# Patient Record
Sex: Female | Born: 1990
Health system: Southern US, Community
[De-identification: ages and names within clinical notes are randomized; demographics above are authoritative.]

## PROBLEM LIST (undated history)

## (undated) DIAGNOSIS — S86012A Strain of left Achilles tendon, initial encounter: Secondary | ICD-10-CM

---

## 2010-11-17 ENCOUNTER — Emergency Department (HOSPITAL_BASED_OUTPATIENT_CLINIC_OR_DEPARTMENT_OTHER)
Admission: EM | Admit: 2010-11-17 | Discharge: 2010-11-17 | Payer: Self-pay | Source: Home / Self Care | Admitting: Emergency Medicine

## 2010-11-18 ENCOUNTER — Emergency Department (HOSPITAL_BASED_OUTPATIENT_CLINIC_OR_DEPARTMENT_OTHER)
Admission: EM | Admit: 2010-11-18 | Discharge: 2010-11-19 | Disposition: A | Discharge status: Still In Hospital | Payer: Self-pay | Source: Home / Self Care | Admitting: Emergency Medicine

## 2011-02-02 LAB — URINE CULTURE: Culture  Setup Time: 201112270408

## 2011-02-02 LAB — GC/CHLAMYDIA PROBE AMP, GENITAL
Chlamydia, DNA Probe: NEGATIVE
GC Probe Amp, Genital: NEGATIVE

## 2011-02-02 LAB — URINALYSIS, ROUTINE W REFLEX MICROSCOPIC
Bilirubin Urine: NEGATIVE
Hgb urine dipstick: NEGATIVE

## 2011-02-02 LAB — URINE MICROSCOPIC-ADD ON

## 2011-02-02 LAB — HCG, QUANTITATIVE, PREGNANCY: hCG, Beta Chain, Quant, S: 187853 m[IU]/mL — ABNORMAL HIGH (ref <5)

## 2011-02-02 LAB — WET PREP, GENITAL: Yeast Wet Prep HPF POC: NONE SEEN

## 2011-02-02 LAB — ABO/RH: ABO/RH(D): B POS

## 2011-02-02 LAB — PREGNANCY, URINE: Preg Test, Ur: POSITIVE

## 2011-05-18 ENCOUNTER — Emergency Department (HOSPITAL_BASED_OUTPATIENT_CLINIC_OR_DEPARTMENT_OTHER)
Admission: EM | Admit: 2011-05-18 | Discharge: 2011-05-18 | Disposition: A | Discharge status: Other Acute Inpatient Hospital | Payer: Medicaid Other | Attending: Emergency Medicine | Admitting: Emergency Medicine

## 2011-05-18 DIAGNOSIS — F17200 Nicotine dependence, unspecified, uncomplicated: Secondary | ICD-10-CM | POA: Insufficient documentation

## 2011-05-18 DIAGNOSIS — R109 Unspecified abdominal pain: Secondary | ICD-10-CM | POA: Insufficient documentation

## 2011-05-18 DIAGNOSIS — O2690 Pregnancy related conditions, unspecified, unspecified trimester: Secondary | ICD-10-CM | POA: Insufficient documentation

## 2014-08-04 ENCOUNTER — Encounter (HOSPITAL_BASED_OUTPATIENT_CLINIC_OR_DEPARTMENT_OTHER): Payer: Self-pay | Admitting: Emergency Medicine

## 2014-08-04 ENCOUNTER — Emergency Department (HOSPITAL_BASED_OUTPATIENT_CLINIC_OR_DEPARTMENT_OTHER)
Admission: EM | Admit: 2014-08-04 | Discharge: 2014-08-05 | Disposition: A | Discharge status: Home / Self Care | Payer: Medicaid Other | Attending: Emergency Medicine | Admitting: Emergency Medicine

## 2014-08-04 DIAGNOSIS — Z87891 Personal history of nicotine dependence: Secondary | ICD-10-CM | POA: Insufficient documentation

## 2014-08-04 DIAGNOSIS — L03319 Cellulitis of trunk, unspecified: Principal | ICD-10-CM

## 2014-08-04 DIAGNOSIS — L02219 Cutaneous abscess of trunk, unspecified: Secondary | ICD-10-CM | POA: Insufficient documentation

## 2014-08-04 DIAGNOSIS — K5900 Constipation, unspecified: Secondary | ICD-10-CM | POA: Insufficient documentation

## 2014-08-04 DIAGNOSIS — K5904 Chronic idiopathic constipation: Secondary | ICD-10-CM

## 2014-08-04 DIAGNOSIS — R350 Frequency of micturition: Secondary | ICD-10-CM | POA: Diagnosis not present

## 2014-08-04 DIAGNOSIS — N390 Urinary tract infection, site not specified: Secondary | ICD-10-CM

## 2014-08-04 DIAGNOSIS — L0291 Cutaneous abscess, unspecified: Secondary | ICD-10-CM

## 2014-08-04 LAB — URINALYSIS, ROUTINE W REFLEX MICROSCOPIC
BILIRUBIN URINE: NEGATIVE
Glucose, UA: NEGATIVE mg/dL
HGB URINE DIPSTICK: NEGATIVE
KETONES UR: NEGATIVE mg/dL
NITRITE: NEGATIVE
PROTEIN: NEGATIVE mg/dL
SPECIFIC GRAVITY, URINE: 1.018 (ref 1.005–1.030)
UROBILINOGEN UA: 1 mg/dL (ref 0.0–1.0)
pH: 7 (ref 5.0–8.0)

## 2014-08-04 LAB — URINE MICROSCOPIC-ADD ON

## 2014-08-04 LAB — PREGNANCY, URINE: PREG TEST UR: NEGATIVE

## 2014-08-04 MED ORDER — SULFAMETHOXAZOLE-TRIMETHOPRIM 800-160 MG PO TABS: 1.0000 | ORAL_TABLET | Freq: Two times a day (BID) | ORAL | Status: DC

## 2014-08-04 MED ORDER — IBUPROFEN 800 MG PO TABS
800.0000 mg | ORAL_TABLET | Freq: Three times a day (TID) | ORAL | Status: DC
Start: 2014-08-04 — End: 2015-02-24

## 2014-08-04 NOTE — ED Notes (Signed)
Pt reports boil in perineal area. Also reports abdominal pain. No drainage from site.

## 2014-08-04 NOTE — Discharge Instructions (Signed)
Abscess °An abscess (boil or furuncle) is an infected area on or under the skin. This area is filled with yellowish-white fluid (pus) and other material (debris). °HOME CARE  °· Only take medicines as told by your doctor. °· If you were given antibiotic medicine, take it as directed. Finish the medicine even if you start to feel better. °· If gauze is used, follow your doctor's directions for changing the gauze. °· To avoid spreading the infection: °¨ Keep your abscess covered with a bandage. °¨ Wash your hands well. °¨ Do not share personal care items, towels, or whirlpools with others. °¨ Avoid skin contact with others. °· Keep your skin and clothes clean around the abscess. °· Keep all doctor visits as told. °GET HELP RIGHT AWAY IF:  °· You have more pain, puffiness (swelling), or redness in the wound site. °· You have more fluid or blood coming from the wound site. °· You have muscle aches, chills, or you feel sick. °· You have a fever. °MAKE SURE YOU:  °· Understand these instructions. °· Will watch your condition. °· Will get help right away if you are not doing well or get worse. °Document Released: 04/27/2008 Document Revised: 05/10/2012 Document Reviewed: 01/22/2012 °ExitCare® Patient Information ©2015 [employer]. This information is not intended to replace advice given to you by your health care provider. Make sure you discuss any questions you have with your health care provider. ° °

## 2014-08-04 NOTE — ED Provider Notes (Signed)
CSN: [id number]     Arrival date & time 08/04/14  2101 History  This chart was scribed for Verania Salberg Smitty Cords, MD by Modena Jansky, ED Scribe. This patient was seen in room MH06/MH06 and the patient's care was started at 11:08 PM.  Chief Complaint  Patient presents with  . Abscess   Patient is a 23 y.o. female presenting with abscess. The history is provided by the patient. No language interpreter was used.  Abscess Location:  Ano-genital Ano-genital abscess location:  Groin Abscess quality: induration   Red streaking: no   Duration:  3 days Progression:  Unchanged Chronicity:  New Context: not diabetes   Relieved by:  None tried Worsened by:  Nothing tried Ineffective treatments:  None tried Associated symptoms: no fever   Risk factors: no family hx of MRSA    HPI Comments: Christina Zavala is a 23 y.o. female who presents to the Emergency Department complaining of a boil in her genital area that started 2 days ago. She states that she shaved her genital area about a week ago. She reports that she also has abdominal pain. She reports that she has been constipated for about 4 days. She states that she has been having some urinary frequency for about 2 weeks. She states that her last LNMP was 2 weeks ago. She denies any fever or dysuria.   History reviewed. No pertinent past medical history. History reviewed. No pertinent past surgical history. No family history on file. History  Substance Use Topics  . Smoking status: Former Games developer  . Smokeless tobacco: Not on file  . Alcohol Use: No   OB History   Grav Para Term Preterm Abortions TAB SAB Ect Mult Living                 Review of Systems  Constitutional: Negative for fever.  Gastrointestinal: Positive for constipation.  Genitourinary: Positive for frequency. Negative for dysuria.  All other systems reviewed and are negative.     Allergies  Review of patient's allergies indicates no known allergies.  Home  Medications   Prior to Admission medications   Not on File   BP 131/73  Pulse 60  Temp(Src) 97.8 F (36.6 C) (Oral)  Resp 16  Ht  (1.626 m)  Wt 120 lb (54.432 kg)  BMI 20.59 kg/m2  SpO2 99%  LMP 07/20/2014 Physical Exam  Nursing note and vitals reviewed. Constitutional: She is oriented to person, place, and time. She appears well-developed and well-nourished. No distress.  HENT:  Head: Normocephalic and atraumatic.  Mouth/Throat: Oropharynx is clear and moist.  Eyes: Pupils are equal, round, and reactive to light.  Neck: Neck supple. No tracheal deviation present.  Cardiovascular: Normal rate, regular rhythm and normal heart sounds.   Pulmonary/Chest: Effort normal. No respiratory distress. She has no wheezes. She has no rales.  Abdominal: Soft. There is no tenderness. There is no rebound and no guarding.  Hyperactive bowel sounds.   Genitourinary:  Loss of stool throughout the colon.  1 in long, 19 cm wide abscess in the right groin area.   Musculoskeletal: Normal range of motion.  Neurological: She is alert and oriented to person, place, and time.  Skin: Skin is warm and dry.  Psychiatric: She has a normal mood and affect. Her behavior is normal.    ED Course  Procedures (including critical care time) DIAGNOSTIC STUDIES: Oxygen Saturation is 99% on RA, normal by my interpretation.    COORDINATION OF CARE: 11:12 PM- Pt  advised of plan for treatment and pt agrees.  Labs Review Labs Reviewed - No data to display  Imaging Review No results found.   EKG Interpretation None      MDM   Final diagnoses:  None    INCISION AND DRAINAGE Performed by: Jasmine Awe Consent: Verbal consent obtained. Risks and benefits: risks, benefits and alternatives were discussed Type: abscess  Body area: mons pubis  Anesthesia: local infiltration  Incision was made with a scalpel.  Local anesthetic: lidocaine 2%  Anesthetic total: 3 ml  Complexity:  complex Blunt dissection to break up loculations  Drainage: purulent  Drainage amount: copious  Patient tolerance: Patient tolerated the procedure well with no immediate complications.   Has uti and constipation.  Miralax daily and Bactrim DS to cover UTI and MRSA.  Follow up with your family doctor.     I personally performed the services described in this documentation, which was scribed in my presence. The recorded information has been reviewed and is accurate.      Jasmine Awe, MD 08/04/14 [id number]

## 2015-02-24 ENCOUNTER — Encounter (HOSPITAL_BASED_OUTPATIENT_CLINIC_OR_DEPARTMENT_OTHER): Payer: Self-pay

## 2015-02-24 ENCOUNTER — Emergency Department (HOSPITAL_BASED_OUTPATIENT_CLINIC_OR_DEPARTMENT_OTHER)
Admission: EM | Admit: 2015-02-24 | Discharge: 2015-02-25 | Disposition: A | Discharge status: Home / Self Care | Payer: Medicaid Other | Attending: Emergency Medicine | Admitting: Emergency Medicine

## 2015-02-24 DIAGNOSIS — Z720 Tobacco use: Secondary | ICD-10-CM | POA: Insufficient documentation

## 2015-02-24 DIAGNOSIS — Z3202 Encounter for pregnancy test, result negative: Secondary | ICD-10-CM | POA: Diagnosis not present

## 2015-02-24 DIAGNOSIS — Z792 Long term (current) use of antibiotics: Secondary | ICD-10-CM | POA: Diagnosis not present

## 2015-02-24 DIAGNOSIS — N12 Tubulo-interstitial nephritis, not specified as acute or chronic: Secondary | ICD-10-CM | POA: Diagnosis not present

## 2015-02-24 DIAGNOSIS — R1030 Lower abdominal pain, unspecified: Secondary | ICD-10-CM | POA: Diagnosis present

## 2015-02-24 DIAGNOSIS — Z9889 Other specified postprocedural states: Secondary | ICD-10-CM | POA: Diagnosis not present

## 2015-02-24 LAB — URINALYSIS, ROUTINE W REFLEX MICROSCOPIC
Bilirubin Urine: NEGATIVE
GLUCOSE, UA: NEGATIVE mg/dL
Hgb urine dipstick: NEGATIVE
KETONES UR: NEGATIVE mg/dL
NITRITE: POSITIVE — AB
Protein, ur: NEGATIVE mg/dL
SPECIFIC GRAVITY, URINE: 1.022 (ref 1.005–1.030)
UROBILINOGEN UA: 1 mg/dL (ref 0.0–1.0)
pH: 6 (ref 5.0–8.0)

## 2015-02-24 LAB — URINE MICROSCOPIC-ADD ON

## 2015-02-24 LAB — PREGNANCY, URINE: PREG TEST UR: NEGATIVE

## 2015-02-24 MED ORDER — PHENAZOPYRIDINE HCL 200 MG PO TABS: 200.0000 mg | ORAL_TABLET | Freq: Three times a day (TID) | ORAL | Status: DC

## 2015-02-24 MED ORDER — PHENAZOPYRIDINE HCL 100 MG PO TABS
200.0000 mg | ORAL_TABLET | Freq: Once | ORAL | Status: AC
  Administered 2015-02-25: 200 mg via ORAL
  Filled 2015-02-24: qty 2

## 2015-02-24 MED ORDER — CEFTRIAXONE SODIUM 1 G IJ SOLR
INTRAMUSCULAR | Status: AC
  Filled 2015-02-24: qty 10

## 2015-02-24 MED ORDER — ONDANSETRON HCL 4 MG/2ML IJ SOLN
4.0000 mg | Freq: Once | INTRAMUSCULAR | Status: AC
  Administered 2015-02-24: 4 mg via INTRAVENOUS
  Filled 2015-02-24: qty 2

## 2015-02-24 MED ORDER — CIPROFLOXACIN HCL 500 MG PO TABS: 500.0000 mg | ORAL_TABLET | Freq: Two times a day (BID) | ORAL | Status: DC

## 2015-02-24 MED ORDER — FENTANYL CITRATE 0.05 MG/ML IJ SOLN
50.0000 ug | Freq: Once | INTRAMUSCULAR | Status: AC
  Administered 2015-02-24: 50 ug via INTRAVENOUS
  Filled 2015-02-24: qty 2

## 2015-02-24 MED ORDER — PROMETHAZINE HCL 25 MG PO TABS: 25.0000 mg | ORAL_TABLET | Freq: Four times a day (QID) | ORAL | Status: DC | PRN

## 2015-02-24 MED ORDER — FLUCONAZOLE 150 MG PO TABS: ORAL_TABLET | ORAL | Status: DC

## 2015-02-24 MED ORDER — DEXTROSE 5 % IV SOLN
1.0000 g | Freq: Once | INTRAVENOUS | Status: AC
  Administered 2015-02-24: 1 g via INTRAVENOUS

## 2015-02-24 MED ORDER — SODIUM CHLORIDE 0.9 % IV BOLUS (SEPSIS): 1000.0000 mL | Freq: Once | INTRAVENOUS | Status: DC

## 2015-02-24 NOTE — ED Provider Notes (Signed)
CSN: [id number]     Arrival date & time 02/24/15  2020 History  This chart was scribed for Paula Libra, MD by Tonye Royalty, ED Scribe. This patient was seen in room MH09/MH09 and the patient's care was started at 10:54 PM.    Chief Complaint  Patient presents with  . Abdominal Pain   The history is provided by the patient. No language interpreter was used.    HPI Comments: Christina Zavala is a 24 y.o. female who presents to the Emergency Department complaining of nausea and vomiting with onset 2 days ago. She reports associated "really bad" lower abdominal pain, sharp back pain, intermittent subjective fever and dysuria. Pain is worse with palpation or movement. Her menses started today. She denies diarrhea or chills.  She alleges to have lost 30 pounds over the past two days. She is not tachycardic.  History reviewed. No pertinent past medical history. Past Surgical History  Procedure Laterality Date  . Cesarean section     No family history on file. History  Substance Use Topics  . Smoking status: Current Every Day Smoker -- 0.50 packs/day    Types: Cigarettes  . Smokeless tobacco: Not on file  . Alcohol Use: Yes     Comment: occ   OB History    No data available     Review of Systems A complete 10 system review of systems was obtained and all systems are negative except as noted in the HPI and PMH.    Allergies  Review of patient's allergies indicates no known allergies.  Home Medications   Prior to Admission medications   Medication Sig Start Date End Date Taking? Authorizing Provider  ibuprofen (ADVIL,MOTRIN) 800 MG tablet Take 1 tablet (800 mg total) by mouth 3 (three) times daily. 08/04/14   April Palumbo, MD  sulfamethoxazole-trimethoprim (SEPTRA DS) 800-160 MG per tablet Take 1 tablet by mouth every 12 (twelve) hours. 08/04/14   April Palumbo, MD   BP 117/80 mmHg  Pulse 87  Temp(Src) 98.8 F (37.1 C) (Oral)  Resp 16  Ht  (1.626 m)  Wt 135 lb (61.236 kg)   BMI 23.16 kg/m2  SpO2 100%  LMP 02/21/2015   Physical Exam  Nursing note and vitals reviewed. General: Well-developed, well-nourished female in no acute distress; appearance consistent with age of record HENT: normocephalic; atraumatic; mucous membranes moist Eyes: pupils equal, round and reactive to light; extraocular muscles intact Neck: supple Heart: regular rate and rhythm; 2/6 systolic murmur at RUSB Lungs: clear to auscultation bilaterally Abdomen: soft; nondistended; no masses or hepatosplenomegaly; bowel sounds present; diffuse abdominal tenderness most prominent in suprapubic region GU: right CVA tenderness Extremities: No deformity; full range of motion; pulses normal Neurologic: Awake, alert and oriented; motor function intact in all extremities and symmetric; no facial droop Skin: Warm and dry Psychiatric: Normal mood and affect   ED Course  Procedures (including critical care time)  DIAGNOSTIC STUDIES: Oxygen Saturation is 100% on room air, normal by my interpretation.    COORDINATION OF CARE: 10:58 PM Discussed treatment plan with patient at beside, the patient agrees with the plan and has no further questions at this time.    MDM   Nursing notes and vitals signs, including pulse oximetry, reviewed.  Summary of this visit's results, reviewed by myself:  Labs:  Results for orders placed or performed during the hospital encounter of 02/24/15 (from the past 24 hour(s))  Urinalysis, Routine w reflex microscopic     Status: Abnormal  Collection Time: 02/24/15  8:32 PM  Result Value Ref Range   Color, Urine AMBER (A) YELLOW   APPearance CLOUDY (A) CLEAR   Specific Gravity, Urine 1.022 1.005 - 1.030   pH 6.0 5.0 - 8.0   Glucose, UA NEGATIVE NEGATIVE mg/dL   Hgb urine dipstick NEGATIVE NEGATIVE   Bilirubin Urine NEGATIVE NEGATIVE   Ketones, ur NEGATIVE NEGATIVE mg/dL   Protein, ur NEGATIVE NEGATIVE mg/dL   Urobilinogen, UA 1.0 0.0 - 1.0 mg/dL   Nitrite  POSITIVE (A) NEGATIVE   Leukocytes, UA LARGE (A) NEGATIVE  Pregnancy, urine     Status: None   Collection Time: 02/24/15  8:32 PM  Result Value Ref Range   Preg Test, Ur NEGATIVE NEGATIVE  Urine microscopic-add on     Status: Abnormal   Collection Time: 02/24/15  8:32 PM  Result Value Ref Range   Squamous Epithelial / LPF FEW (A) RARE   WBC, UA 21-50 <3 WBC/hpf   RBC / HPF 0-2 <3 RBC/hpf   Bacteria, UA MANY (A) RARE   11:55 PM Murmur has resolved following hydration consistent with a flow murmur. History and exam consistent with pyelonephritis. Patient does not appear sick enough at this time for hospitalization.  I personally performed the services described in this documentation, which was scribed in my presence. The recorded information has been reviewed and is accurate.   Paula LibraJohn Aleeha Boline, MD 02/24/15 [phone]

## 2015-02-24 NOTE — ED Notes (Signed)
Pt reports abd pain, n/v, dysuria.  Denies diarrhea.  Pt reports subjective fever.  States she has lost 30 lb in 2 days d/t vomiting.  Clarified this statement and pt reports as true.

## 2015-02-24 NOTE — Discharge Instructions (Signed)
Pyelonephritis, Adult °Pyelonephritis is a kidney infection. In general, there are 2 main types of pyelonephritis: °· Infections that come on quickly without any warning (acute pyelonephritis). °· Infections that persist for a long period of time (chronic pyelonephritis). °CAUSES  °Two main causes of pyelonephritis are: °· Bacteria traveling from the bladder to the kidney. This is a problem especially in pregnant women. The urine in the bladder can become filled with bacteria from multiple causes, including: °¨ Inflammation of the prostate gland (prostatitis). °¨ Sexual intercourse in females. °¨ Bladder infection (cystitis). °· Bacteria traveling from the bloodstream to the tissue part of the kidney. °Problems that may increase your risk of getting a kidney infection include: °· Diabetes. °· Kidney stones or bladder stones. °· Cancer. °· Catheters placed in the bladder. °· Other abnormalities of the kidney or ureter. °SYMPTOMS  °· Abdominal pain. °· Pain in the side or flank area. °· Fever. °· Chills. °· Upset stomach. °· Blood in the urine (dark urine). °· Frequent urination. °· Strong or persistent urge to urinate. °· Burning or stinging when urinating. °DIAGNOSIS  °Your caregiver may diagnose your kidney infection based on your symptoms. A urine sample may also be taken. °TREATMENT  °In general, treatment depends on how severe the infection is.  °· If the infection is mild and caught early, your caregiver may treat you with oral antibiotics and send you home. °· If the infection is more severe, the bacteria may have gotten into the bloodstream. This will require intravenous (IV) antibiotics and a hospital stay. Symptoms may include: °¨ High fever. °¨ Severe flank pain. °¨ Shaking chills. °· Even after a hospital stay, your caregiver may require you to be on oral antibiotics for a period of time. °· Other treatments may be required depending upon the cause of the infection. °HOME CARE INSTRUCTIONS  °· Take your  antibiotics as directed. Finish them even if you start to feel better. °· Make an appointment to have your urine checked to make sure the infection is gone. °· Drink enough fluids to keep your urine clear or pale yellow. °· Take medicines for the bladder if you have urgency and frequency of urination as directed by your caregiver. °SEEK IMMEDIATE MEDICAL CARE IF:  °· You have a fever or persistent symptoms for more than 2-3 days. °· You have a fever and your symptoms suddenly get worse. °· You are unable to take your antibiotics or fluids. °· You develop shaking chills. °· You experience extreme weakness or fainting. °· There is no improvement after 2 days of treatment. °MAKE SURE YOU: °· Understand these instructions. °· Will watch your condition. °· Will get help right away if you are not doing well or get worse. °Document Released: 11/09/2005 Document Revised: 05/10/2012 Document Reviewed: 04/15/2011 °ExitCare® Patient Information ©2015 [employer]. This information is not intended to replace advice given to you by your health care provider. Make sure you discuss any questions you have with your health care provider. ° °

## 2015-02-27 LAB — URINE CULTURE

## 2015-02-28 ENCOUNTER — Telehealth (HOSPITAL_BASED_OUTPATIENT_CLINIC_OR_DEPARTMENT_OTHER): Payer: Self-pay | Admitting: Emergency Medicine

## 2015-02-28 NOTE — Telephone Encounter (Signed)
Post ED Visit - Positive Culture Follow-up  Culture report reviewed by antimicrobial stewardship pharmacist: []  Wes Dulaney, Pharm.D., BCPS [x]  Celedonio MiyamotoJeremy Frens, [address] BCPS []  Georgina PillionElizabeth Martin, Pharm.D., BCPS []  BeattieMinh Pham, [address] BCPS, AAHIVP []  Estella HuskMichelle Turner, Pharm.D., BCPS, AAHIVP []  Elder CyphersLorie Poole, [address] BCPS  Positive urine culture E. Coli Treated with ciprofloxacin and flucanazole, organism sensitive to the same and no further patient follow-up is required at this time.  Berle MullMiller, Twylah Bennetts 02/28/2015, 11:07 AM

## 2015-10-13 ENCOUNTER — Encounter (HOSPITAL_BASED_OUTPATIENT_CLINIC_OR_DEPARTMENT_OTHER): Payer: Self-pay | Admitting: Emergency Medicine

## 2015-10-13 ENCOUNTER — Emergency Department (HOSPITAL_BASED_OUTPATIENT_CLINIC_OR_DEPARTMENT_OTHER)
Admission: EM | Admit: 2015-10-13 | Discharge: 2015-10-14 | Disposition: A | Discharge status: Home / Self Care | Payer: Medicaid Other | Attending: Emergency Medicine | Admitting: Emergency Medicine

## 2015-10-13 DIAGNOSIS — N3000 Acute cystitis without hematuria: Secondary | ICD-10-CM | POA: Diagnosis not present

## 2015-10-13 DIAGNOSIS — F17210 Nicotine dependence, cigarettes, uncomplicated: Secondary | ICD-10-CM | POA: Diagnosis not present

## 2015-10-13 DIAGNOSIS — L02214 Cutaneous abscess of groin: Secondary | ICD-10-CM | POA: Diagnosis not present

## 2015-10-13 DIAGNOSIS — R05 Cough: Secondary | ICD-10-CM | POA: Insufficient documentation

## 2015-10-13 MED ORDER — HYDROCODONE-ACETAMINOPHEN 5-325 MG PO TABS
1.0000 | ORAL_TABLET | Freq: Once | ORAL | Status: AC
  Administered 2015-10-14: 1 via ORAL
  Filled 2015-10-13: qty 1

## 2015-10-13 MED ORDER — LIDOCAINE-EPINEPHRINE (PF) 2 %-1:200000 IJ SOLN
INTRAMUSCULAR | Status: AC
  Administered 2015-10-14: 10 mL via INTRADERMAL
  Filled 2015-10-13: qty 10

## 2015-10-13 MED ORDER — HYDROCODONE-ACETAMINOPHEN 5-325 MG PO TABS: 1.0000 | ORAL_TABLET | Freq: Four times a day (QID) | ORAL | Status: DC | PRN

## 2015-10-13 NOTE — Discharge Instructions (Signed)
Incision and Drainage Incision and drainage is a procedure in which a sac-like structure (cystic structure) is opened and drained. The area to be drained usually contains material such as pus, fluid, or blood.  LET YOUR CAREGIVER KNOW ABOUT:   Allergies to medicine.  Medicines taken, including vitamins, herbs, eyedrops, over-the-counter medicines, and creams.  Use of steroids (by mouth or creams).  Previous problems with anesthetics or numbing medicines.  History of bleeding problems or blood clots.  Previous surgery.  Other health problems, including diabetes and kidney problems.  Possibility of pregnancy, if this applies. RISKS AND COMPLICATIONS  Pain.  Bleeding.  Scarring.  Infection. BEFORE THE PROCEDURE  You may need to have an ultrasound or other imaging tests to see how large or deep your cystic structure is. Blood tests may also be used to determine if you have an infection or how severe the infection is. You may need to have a tetanus shot. PROCEDURE  The affected area is cleaned with a cleaning fluid. The cyst area will then be numbed with a medicine (local anesthetic). A small incision will be made in the cystic structure. A syringe or catheter may be used to drain the contents of the cystic structure, or the contents may be squeezed out. The area will then be flushed with a cleansing solution. After cleansing the area, it is often gently packed with a gauze or another wound dressing. Once it is packed, it will be covered with gauze and tape or some other type of wound dressing. AFTER THE PROCEDURE   Often, you will be allowed to go home right after the procedure.  You may be given antibiotic medicine to prevent or heal an infection.  If the area was packed with gauze or some other wound dressing, you will likely need to come back in 1 to 2 days to get it removed.  The area should heal in about 14 days.   This information is not intended to replace advice given  to you by your health care provider. Make sure you discuss any questions you have with your health care provider.   Document Released: 05/05/2001 Document Revised: 05/10/2012 Document Reviewed: 01/04/2012 Elsevier Interactive Patient Education 2016 [employer]  

## 2015-10-13 NOTE — ED Provider Notes (Signed)
CSN: [id number]     Arrival date & time 10/13/15  2143 History  By signing my name below, I, Gonzella LexKimberly Bianca Gray, attest that this documentation has been prepared under the direction and in the presence of Paula LibraJohn Deshawnda Acrey, MD. Electronically Signed: Gonzella LexKimberly Bianca Gray, Scribe. 10/13/2015. 11:51 PM.     Chief Complaint  Patient presents with  . Abscess    HPI  HPI Comments: Christina CounterJacquese Mccarrick is a 24 y.o. female who presents to the Emergency Department complaining of a  boil on her right groin. Pt reports she also began feeling sick, weak, dizzy, and light headed two weeks ago. She reports vomiting as well. She reports that she usually gets these similar abscesses whenever she gets sick, and this boil has come up over the past several days. It is moderately painful, worse with palpation or movement. It has not been draining Pt states that when she gets these boils the physician will normally open them and treat her with antibiotics. She has no other complaints at this time except lingering cough and mild dysuria.  History reviewed. No pertinent past medical history. Past Surgical History  Procedure Laterality Date  . Cesarean section     History reviewed. No pertinent family history. Social History  Substance Use Topics  . Smoking status: Current Every Day Smoker -- 0.50 packs/day    Types: Cigarettes  . Smokeless tobacco: None  . Alcohol Use: Yes     Comment: occ   OB History    No data available     Review of Systems A complete 10 system review of systems was obtained and all systems are negative except as noted in the HPI and PMH.     Allergies  Tylenol  Home Medications   Prior to Admission medications   Medication Sig Start Date End Date Taking? Authorizing Provider  HYDROcodone-acetaminophen (NORCO) 5-325 MG tablet Take 1-2 tablets by mouth every 6 (six) hours as needed. 10/13/15   Natalin Bible, MD   BP 150/92 mmHg  Pulse 74  Temp(Src) 97.4 F (36.3 C) (Oral)  Resp  18  Ht 5\' 3"  (1.6 m)  Wt 123 lb (55.792 kg)  BMI 21.79 kg/m2  SpO2 100%  LMP 09/02/2015   Physical Exam  General: Well-developed, well-nourished female in no acute distress; appearance consistent with age of record HENT: normocephalic; atraumatic Eyes: pupils equal, round and reactive to light; extraocular muscles intact Neck: supple Heart: regular rate and rhythm;  Lungs: clear to auscultation bilaterally Abdomen: soft; nondistended; nontender; bowel sounds present Extremities: No deformity; full range of motion; pulses normal Neurologic: Awake, alert and oriented; motor function intact in all extremities and symmetric; no facial droop Skin: Warm and dry; abscess of her right mons pubis just medial to the groin fold Psychiatric: Normal mood and affect   ED Course  Procedures    INCISION AND DRAINAGE PROCEDURE NOTE: Patient identification was confirmed and verbal consent was obtained. This procedure was performed by Paula LibraJohn Kalilah Barua, MD at 11:51 PM. Site: right mons pubis Sterile procedures observed Needle size: 25 gauge Anesthetic used (type and amt): Lidocaine 2% with epinephrine, 2 milliliters Blade size: #11 Drainage: copious, purulent, foul-smelling Complexity: Complex Packing used: 1/4" iodoform gauze Site anesthetized, incision made over site, wound drained and explored loculations, wound packed with sterile iodoform gauze, covered with dry, sterile dressing.  Pt tolerated procedure well without complications.  Instructions for care discussed verbally and pt provided with additional written instructions for homecare and f/u.  Chaperone Banker(RN) was  present for exam which was performed with no discomfort or complications.    MDM  Nursing notes and vitals signs, including pulse oximetry, reviewed.  Summary of this visit's results, reviewed by myself:  Labs:  Results for orders placed or performed during the hospital encounter of 10/13/15 (from the past 24 hour(s))   Urinalysis, Routine w reflex microscopic (not at [facility])     Status: Abnormal   Collection Time: 10/14/15 12:04 AM  Result Value Ref Range   Color, Urine YELLOW YELLOW   APPearance CLEAR CLEAR   Specific Gravity, Urine 1.014 1.005 - 1.030   pH 6.0 5.0 - 8.0   Glucose, UA NEGATIVE NEGATIVE mg/dL   Hgb urine dipstick NEGATIVE NEGATIVE   Bilirubin Urine NEGATIVE NEGATIVE   Ketones, ur NEGATIVE NEGATIVE mg/dL   Protein, ur NEGATIVE NEGATIVE mg/dL   Nitrite NEGATIVE NEGATIVE   Leukocytes, UA MODERATE (A) NEGATIVE  Pregnancy, urine     Status: None   Collection Time: 10/14/15 12:04 AM  Result Value Ref Range   Preg Test, Ur NEGATIVE NEGATIVE  Urine microscopic-add on     Status: Abnormal   Collection Time: 10/14/15 12:04 AM  Result Value Ref Range   Squamous Epithelial / LPF 0-5 (A) NONE SEEN   WBC, UA 6-30 0 - 5 WBC/hpf   RBC / HPF 0-5 0 - 5 RBC/hpf   Bacteria, UA FEW (A) NONE SEEN     Final diagnoses:  Abscess of right groin   I personally performed the services described in this documentation, which was scribed in my presence. The recorded information has been reviewed and is accurate.     Paula Libra, MD 10/14/15 [phone]

## 2015-10-13 NOTE — ED Notes (Addendum)
Patient reports abscess to vagina.  Reports frequent abscesses every 2-3 months in the same area.  Reports when they occur that they cause her to become dizzy.  Pt also reports nausea and vomiting.  Reports current abscess is the size of a baseball.

## 2015-10-13 NOTE — ED Notes (Signed)
MD at bedside. 

## 2015-10-14 LAB — URINE MICROSCOPIC-ADD ON

## 2015-10-14 LAB — URINALYSIS, ROUTINE W REFLEX MICROSCOPIC
BILIRUBIN URINE: NEGATIVE
GLUCOSE, UA: NEGATIVE mg/dL
HGB URINE DIPSTICK: NEGATIVE
KETONES UR: NEGATIVE mg/dL
Nitrite: NEGATIVE
PROTEIN: NEGATIVE mg/dL
Specific Gravity, Urine: 1.014 (ref 1.005–1.030)
pH: 6 (ref 5.0–8.0)

## 2015-10-14 LAB — PREGNANCY, URINE: PREG TEST UR: NEGATIVE

## 2015-10-14 MED ORDER — NITROFURANTOIN MONOHYD MACRO 100 MG PO CAPS
100.0000 mg | ORAL_CAPSULE | Freq: Once | ORAL | Status: AC
  Administered 2015-10-14: 100 mg via ORAL
  Filled 2015-10-14: qty 1

## 2015-10-14 MED ORDER — NITROFURANTOIN MONOHYD MACRO 100 MG PO CAPS: 100.0000 mg | ORAL_CAPSULE | Freq: Two times a day (BID) | ORAL | Status: DC

## 2015-10-14 MED ORDER — LIDOCAINE-EPINEPHRINE (PF) 2 %-1:200000 IJ SOLN
10.0000 mL | Freq: Once | INTRAMUSCULAR | Status: AC
  Administered 2015-10-14: 10 mL via INTRADERMAL

## 2015-10-15 LAB — URINE CULTURE: Culture: NO GROWTH

## 2016-06-13 ENCOUNTER — Emergency Department (HOSPITAL_BASED_OUTPATIENT_CLINIC_OR_DEPARTMENT_OTHER): Payer: Medicaid Other

## 2016-06-13 ENCOUNTER — Encounter (HOSPITAL_BASED_OUTPATIENT_CLINIC_OR_DEPARTMENT_OTHER): Payer: Self-pay | Admitting: *Deleted

## 2016-06-13 ENCOUNTER — Emergency Department (HOSPITAL_BASED_OUTPATIENT_CLINIC_OR_DEPARTMENT_OTHER)
Admission: EM | Admit: 2016-06-13 | Discharge: 2016-06-13 | Disposition: A | Discharge status: Home / Self Care | Payer: Medicaid Other | Attending: Emergency Medicine | Admitting: Emergency Medicine

## 2016-06-13 DIAGNOSIS — R197 Diarrhea, unspecified: Secondary | ICD-10-CM

## 2016-06-13 DIAGNOSIS — E876 Hypokalemia: Secondary | ICD-10-CM | POA: Diagnosis not present

## 2016-06-13 DIAGNOSIS — R0789 Other chest pain: Secondary | ICD-10-CM | POA: Diagnosis not present

## 2016-06-13 DIAGNOSIS — A599 Trichomoniasis, unspecified: Secondary | ICD-10-CM | POA: Diagnosis not present

## 2016-06-13 DIAGNOSIS — R1031 Right lower quadrant pain: Secondary | ICD-10-CM | POA: Diagnosis present

## 2016-06-13 DIAGNOSIS — F17210 Nicotine dependence, cigarettes, uncomplicated: Secondary | ICD-10-CM | POA: Diagnosis not present

## 2016-06-13 DIAGNOSIS — R112 Nausea with vomiting, unspecified: Secondary | ICD-10-CM

## 2016-06-13 LAB — URINE MICROSCOPIC-ADD ON

## 2016-06-13 LAB — URINALYSIS, ROUTINE W REFLEX MICROSCOPIC
Bilirubin Urine: NEGATIVE
Bilirubin Urine: NEGATIVE
Glucose, UA: NEGATIVE mg/dL
Glucose, UA: NEGATIVE mg/dL
Hgb urine dipstick: NEGATIVE
Hgb urine dipstick: NEGATIVE
Ketones, ur: 15 mg/dL — AB
Ketones, ur: NEGATIVE mg/dL
Nitrite: NEGATIVE
Nitrite: NEGATIVE
Protein, ur: 30 mg/dL — AB
Protein, ur: 30 mg/dL — AB
Specific Gravity, Urine: 1.039 — ABNORMAL HIGH (ref 1.005–1.030)
Specific Gravity, Urine: 1.031 — ABNORMAL HIGH (ref 1.005–1.030)
pH: 6 (ref 5.0–8.0)
pH: 6 (ref 5.0–8.0)

## 2016-06-13 LAB — CBC WITH DIFFERENTIAL/PLATELET
Basophils Absolute: 0 10*3/uL (ref 0.0–0.1)
Basophils Relative: 0 %
Eosinophils Absolute: 0 10*3/uL (ref 0.0–0.7)
Eosinophils Relative: 1 %
HCT: 37.6 % (ref 36.0–46.0)
Hemoglobin: 13 g/dL (ref 12.0–15.0)
Lymphocytes Relative: 11 %
Lymphs Abs: 0.9 10*3/uL (ref 0.7–4.0)
MCH: 32.7 pg (ref 26.0–34.0)
MCHC: 34.6 g/dL (ref 30.0–36.0)
MCV: 94.7 fL (ref 78.0–100.0)
Monocytes Absolute: 0.5 10*3/uL (ref 0.1–1.0)
Monocytes Relative: 6 %
Neutro Abs: 7.1 10*3/uL (ref 1.7–7.7)
Neutrophils Relative %: 82 %
Platelets: 206 10*3/uL (ref 150–400)
RBC: 3.97 MIL/uL (ref 3.87–5.11)
RDW: 12.8 % (ref 11.5–15.5)
WBC: 8.6 10*3/uL (ref 4.0–10.5)

## 2016-06-13 LAB — BASIC METABOLIC PANEL
Anion gap: 7 (ref 5–15)
BUN: 10 mg/dL (ref 6–20)
CO2: 23 mmol/L (ref 22–32)
Calcium: 8.7 mg/dL — ABNORMAL LOW (ref 8.9–10.3)
Chloride: 107 mmol/L (ref 101–111)
Creatinine, Ser: 0.79 mg/dL (ref 0.44–1.00)
GFR calc Af Amer: >60 mL/min (ref >60)
GFR calc non Af Amer: >60 mL/min (ref >60)
Glucose, Bld: 101 mg/dL — ABNORMAL HIGH (ref 65–99)
Potassium: 3.1 mmol/L — ABNORMAL LOW (ref 3.5–5.1)
Sodium: 137 mmol/L (ref 135–145)

## 2016-06-13 LAB — WET PREP, GENITAL
Sperm: NONE SEEN
Yeast Wet Prep HPF POC: NONE SEEN

## 2016-06-13 LAB — LIPASE, BLOOD: Lipase: 20 U/L (ref 11–51)

## 2016-06-13 LAB — PREGNANCY, URINE: Preg Test, Ur: NEGATIVE

## 2016-06-13 MED ORDER — CEPHALEXIN 500 MG PO CAPS: 500.0000 mg | ORAL_CAPSULE | Freq: Two times a day (BID) | ORAL | Status: DC

## 2016-06-13 MED ORDER — SODIUM CHLORIDE 0.9 % IV BOLUS (SEPSIS)
1000.0000 mL | Freq: Once | INTRAVENOUS | Status: AC
  Administered 2016-06-13: 1000 mL via INTRAVENOUS

## 2016-06-13 MED ORDER — METRONIDAZOLE 500 MG PO TABS
2000.0000 mg | ORAL_TABLET | Freq: Once | ORAL | Status: AC
  Administered 2016-06-13: 2000 mg via ORAL
  Filled 2016-06-13: qty 4

## 2016-06-13 MED ORDER — DIPHENHYDRAMINE HCL 50 MG/ML IJ SOLN
25.0000 mg | Freq: Once | INTRAMUSCULAR | Status: AC
  Administered 2016-06-13: 25 mg via INTRAVENOUS
  Filled 2016-06-13: qty 1

## 2016-06-13 MED ORDER — ONDANSETRON HCL 4 MG PO TABS: 4.0000 mg | ORAL_TABLET | Freq: Four times a day (QID) | ORAL | Status: DC

## 2016-06-13 MED ORDER — KETOROLAC TROMETHAMINE 30 MG/ML IJ SOLN
30.0000 mg | Freq: Once | INTRAMUSCULAR | Status: AC
  Administered 2016-06-13: 30 mg via INTRAVENOUS
  Filled 2016-06-13: qty 1

## 2016-06-13 MED ORDER — AZITHROMYCIN 250 MG PO TABS
1000.0000 mg | ORAL_TABLET | Freq: Once | ORAL | Status: AC
  Administered 2016-06-13: 1000 mg via ORAL
  Filled 2016-06-13: qty 4

## 2016-06-13 MED ORDER — POTASSIUM CHLORIDE CRYS ER 20 MEQ PO TBCR
40.0000 meq | EXTENDED_RELEASE_TABLET | Freq: Once | ORAL | Status: AC
Start: 2016-06-13 — End: 2016-06-13
  Administered 2016-06-13: 40 meq via ORAL
  Filled 2016-06-13: qty 2

## 2016-06-13 MED ORDER — METOCLOPRAMIDE HCL 5 MG/ML IJ SOLN
10.0000 mg | Freq: Once | INTRAMUSCULAR | Status: AC
  Administered 2016-06-13: 10 mg via INTRAVENOUS
  Filled 2016-06-13: qty 2

## 2016-06-13 MED ORDER — DEXTROSE 5 % IV SOLN
1.0000 g | Freq: Once | INTRAVENOUS | Status: AC
  Administered 2016-06-13: 1 g via INTRAVENOUS
  Filled 2016-06-13: qty 10

## 2016-06-13 NOTE — Discharge Instructions (Signed)
Medications: Keflex, Zofran  Treatment: Take Keflex as prescribed for 1 week for your urinary tract infection. Take Zofran every 6 hours as needed for nausea and vomiting. Drink plenty of fluids, at least 8 glasses of water daily. You will be called in 2-3 days if any of your results return positive. Please make your sexual partners aware that your positive for Trichomonas and if anything else is positive in that they will need to seek treatment.  Follow-up: Please follow-up with your OB/GYN for further evaluation and treatment and follow-up of today's visit. Please return to the emergency department if you develop any new or worsening symptoms.  Nausea and Vomiting Nausea is a sick feeling that often comes before throwing up (vomiting). Vomiting is a reflex where stomach contents come out of your mouth. Vomiting can cause severe loss of body fluids (dehydration). Children and elderly adults can become dehydrated quickly, especially if they also have diarrhea. Nausea and vomiting are symptoms of a condition or disease. It is important to find the cause of your symptoms. CAUSES   Direct irritation of the stomach lining. This irritation can result from increased acid production (gastroesophageal reflux disease), infection, food poisoning, taking certain medicines (such as nonsteroidal anti-inflammatory drugs), alcohol use, or tobacco use.  Signals from the brain.These signals could be caused by a headache, heat exposure, an inner ear disturbance, increased pressure in the brain from injury, infection, a tumor, or a concussion, pain, emotional stimulus, or metabolic problems.  An obstruction in the gastrointestinal tract (bowel obstruction).  Illnesses such as diabetes, hepatitis, gallbladder problems, appendicitis, kidney problems, cancer, sepsis, atypical symptoms of a heart attack, or eating disorders.  Medical treatments such as chemotherapy and radiation.  Receiving medicine that makes you  sleep (general anesthetic) during surgery. DIAGNOSIS Your caregiver may ask for tests to be done if the problems do not improve after a few days. Tests may also be done if symptoms are severe or if the reason for the nausea and vomiting is not clear. Tests may include:  Urine tests.  Blood tests.  Stool tests.  Cultures (to look for evidence of infection).  X-rays or other imaging studies. Test results can help your caregiver make decisions about treatment or the need for additional tests. TREATMENT You need to stay well hydrated. Drink frequently but in small amounts.You may wish to drink water, sports drinks, clear broth, or eat frozen ice pops or gelatin dessert to help stay hydrated.When you eat, eating slowly may help prevent nausea.There are also some antinausea medicines that may help prevent nausea. HOME CARE INSTRUCTIONS   Take all medicine as directed by your caregiver.  If you do not have an appetite, do not force yourself to eat. However, you must continue to drink fluids.  If you have an appetite, eat a normal diet unless your caregiver tells you differently.  Eat a variety of complex carbohydrates (rice, wheat, potatoes, bread), lean meats, yogurt, fruits, and vegetables.  Avoid high-fat foods because they are more difficult to digest.  Drink enough water and fluids to keep your urine clear or pale yellow.  If you are dehydrated, ask your caregiver for specific rehydration instructions. Signs of dehydration may include:  Severe thirst.  Dry lips and mouth.  Dizziness.  Dark urine.  Decreasing urine frequency and amount.  Confusion.  Rapid breathing or pulse. SEEK IMMEDIATE MEDICAL CARE IF:   You have blood or brown flecks (like coffee grounds) in your vomit.  You have black or bloody stools.  You have a severe headache or stiff neck.  You are confused.  You have severe abdominal pain.  You have chest pain or trouble breathing.  You do not  urinate at least once every 8 hours.  You develop cold or clammy skin.  You continue to vomit for longer than 24 to 48 hours.  You have a fever. MAKE SURE YOU:   Understand these instructions.  Will watch your condition.  Will get help right away if you are not doing well or get worse.   This information is not intended to replace advice given to you by your health care provider. Make sure you discuss any questions you have with your health care provider.   Document Released: 11/09/2005 Document Revised: 02/01/2012 Document Reviewed: 04/08/2011 Elsevier Interactive Patient Education [employer]  Sexually Transmitted Disease A sexually transmitted disease (STD) is a disease or infection that may be passed (transmitted) from person to person, usually during sexual activity. This may happen by way of saliva, semen, blood, vaginal mucus, or urine. Common STDs include:  Gonorrhea.  Chlamydia.  Syphilis.  HIV and AIDS.  Genital herpes.  Hepatitis B and C.  Trichomonas.  Human papillomavirus (HPV).  Pubic lice.  Scabies.  Mites.  Bacterial vaginosis. WHAT ARE CAUSES OF STDs? An STD may be caused by bacteria, a virus, or parasites. STDs are often transmitted during sexual activity if one person is infected. However, they may also be transmitted through nonsexual means. STDs may be transmitted after:   Sexual intercourse with an infected person.  Sharing sex toys with an infected person.  Sharing needles with an infected person or using unclean piercing or tattoo needles.  Having intimate contact with the genitals, mouth, or rectal areas of an infected person.  Exposure to infected fluids during birth. WHAT ARE THE SIGNS AND SYMPTOMS OF STDs? Different STDs have different symptoms. Some people may not have any symptoms. If symptoms are present, they may include:  Painful or bloody urination.  Pain in the pelvis, abdomen, vagina, anus, throat, or  eyes.  A skin rash, itching, or irritation.  Growths, ulcerations, blisters, or sores in the genital and anal areas.  Abnormal vaginal discharge with or without bad odor.  Penile discharge in men.  Fever.  Pain or bleeding during sexual intercourse.  Swollen glands in the groin area.  Yellow skin and eyes (jaundice). This is seen with hepatitis.  Swollen testicles.  Infertility.  Sores and blisters in the mouth. HOW ARE STDs DIAGNOSED? To make a diagnosis, your health care provider may:  Take a medical history.  Perform a physical exam.  Take a sample of any discharge to examine.  Swab the throat, cervix, opening to the penis, rectum, or vagina for testing.  Test a sample of your first morning urine.  Perform blood tests.  Perform a Pap test, if this applies.  Perform a colposcopy.  Perform a laparoscopy. HOW ARE STDs TREATED? Treatment depends on the STD. Some STDs may be treated but not cured.  Chlamydia, gonorrhea, trichomonas, and syphilis can be cured with antibiotic medicine.  Genital herpes, hepatitis, and HIV can be treated, but not cured, with prescribed medicines. The medicines lessen symptoms.  Genital warts from HPV can be treated with medicine or by freezing, burning (electrocautery), or surgery. Warts may come back.  HPV cannot be cured with medicine or surgery. However, abnormal areas may be removed from the cervix, vagina, or vulva.  If your diagnosis is confirmed, your recent sexual  partners need treatment. This is true even if they are symptom-free or have a negative culture or evaluation. They should not have sex until their health care providers say it is okay.  Your health care provider may test you for infection again 3 months after treatment. HOW CAN I REDUCE MY RISK OF GETTING AN STD? Take these steps to reduce your risk of getting an STD:  Use latex condoms, dental dams, and water-soluble lubricants during sexual activity. Do not  use petroleum jelly or oils.  Avoid having multiple sex partners.  Do not have sex with someone who has other sex partners  Do not have sex with anyone you do not know or who is at high risk for an STD.  Avoid risky sex practices that can break your skin.  Do not have sex if you have open sores on your mouth or skin.  Avoid drinking too much alcohol or taking illegal drugs. Alcohol and drugs can affect your judgment and put you in a vulnerable position.  Avoid engaging in oral and anal sex acts.  Get vaccinated for HPV and hepatitis. If you have not received these vaccines in the past, talk to your health care provider about whether one or both might be right for you.  If you are at risk of being infected with HIV, it is recommended that you take a prescription medicine daily to prevent HIV infection. This is called pre-exposure prophylaxis (PrEP). You are considered at risk if:  You are a man who has sex with other men (MSM).  You are a heterosexual man or woman and are sexually active with more than one partner.  You take drugs by injection.  You are sexually active with a partner who has HIV.  Talk with your health care provider about whether you are at high risk of being infected with HIV. If you choose to begin PrEP, you should first be tested for HIV. You should then be tested every 3 months for as long as you are taking PrEP. WHAT SHOULD I DO IF I THINK I HAVE AN STD?  See your health care provider.  Tell your sexual partner(s). They should be tested and treated for any STDs.  Do not have sex until your health care provider says it is okay. WHEN SHOULD I GET IMMEDIATE MEDICAL CARE? Contact your health care provider right away if:   You have severe abdominal pain.  You are a man and notice swelling or pain in your testicles.  You are a woman and notice swelling or pain in your vagina.   This information is not intended to replace advice given to you by your health  care provider. Make sure you discuss any questions you have with your health care provider.   Document Released: 01/30/2003 Document Revised: 11/30/2014 Document Reviewed: 05/30/2013 Elsevier Interactive Patient Education [employer]

## 2016-06-13 NOTE — ED Provider Notes (Signed)
CSN: [id number]     Arrival date & time 06/13/16  1615 History  By signing my name below, I, Linna Darner, attest that this documentation has been prepared under the direction and in the presence of non-physician practitioner, Buel Ream, PA-C. Electronically Signed: Linna Darner, Scribe. 06/13/2016. 4:50 PM.    Chief Complaint  Patient presents with  . Emesis    The history is provided by the patient. No language interpreter was used.     HPI Comments: Christina Zavala is a 25 y.o. female who presents to the Emergency Department complaining of sudden onset, intermittent, vomiting x15 beginning this morning. Pt reports that she experienced nausea and intermittent, sharp, bilateral lower abdominal pain/cramping last night. Pt states she experiences sharp sternal pain when her abdominal pain presents. She also endorses abdominal pain radiation into her lower back and bilateral thighs. She notes some diarrhea since this morning and states she has not been able to make it to the bathroom. Pt reports that she drank water this morning and vomited clear fluid, and then began vomiting "black specks." She states she ate noodles an hour PTA and regurgitated them. Pt also notes a headache currently that is causing drowsiness and fatigue; she notes a h/o of migraines and states her current headache feels like her migraines. Pt also reports malodorous vaginal discharge for the last two weeks. She was seen in the ER several weeks ago and was diagnosed with bacterial vaginosis. She reports that she stopped eating pork for the last 3 months, but ate beans yesterday that had pork prior to her symptoms presenting. Otherwise, the patient did not eat anything out of the ordinary. She has tried [employer] for pain with no relief. Pt notes a h/o kidney infection but states her current abdominal pain is more severe. LMP was at the end of last month. Pt states she is sexually active and doesn't use birth control. She  denies hematemesis, hematochezia, bloody stools, weakness, SOB, dysuria, numbness, neuro deficits, or any other associated symptoms.  History reviewed. No pertinent past medical history. Past Surgical History  Procedure Laterality Date  . Cesarean section     History reviewed. No pertinent family history. Social History  Substance Use Topics  . Smoking status: Current Every Day Smoker -- 0.50 packs/day    Types: Cigarettes  . Smokeless tobacco: None  . Alcohol Use: Yes     Comment: occ   OB History    No data available     Review of Systems  Constitutional: Positive for fatigue.  Respiratory: Negative for shortness of breath.   Cardiovascular: Positive for chest pain (sternal).  Gastrointestinal: Positive for nausea, vomiting, abdominal pain and diarrhea. Negative for blood in stool.       Negative for hematemesis.  Genitourinary: Positive for vaginal discharge (malodorous). Negative for dysuria.  Musculoskeletal: Positive for myalgias (bilateral thighs) and back pain (lower back).  Skin: Negative for rash and wound.  Neurological: Positive for headaches. Negative for weakness and numbness.       Negative for sensation loss.  Psychiatric/Behavioral: The patient is not nervous/anxious.     Allergies  Tylenol  Home Medications   Prior to Admission medications   Medication Sig Start Date End Date Taking? Authorizing Provider  cephALEXin (KEFLEX) 500 MG capsule Take 1 capsule (500 mg total) by mouth 2 (two) times daily. 06/13/16   Emi Holes, PA-C  HYDROcodone-acetaminophen (NORCO) 5-325 MG tablet Take 1-2 tablets by mouth every 6 (six) hours as needed. 10/13/15  John Molpus, MD  nitrofurantoin, macrocrystal-monohydrate, (MACROBID) 100 MG capsule Take 1 capsule (100 mg total) by mouth 2 (two) times daily. 10/14/15   John Molpus, MD  ondansetron (ZOFRAN) 4 MG tablet Take 1 tablet (4 mg total) by mouth every 6 (six) hours. 06/13/16   Darrik Richman M Ramzey Petrovic, PA-C   BP 108/78 mmHg   Pulse 50  Temp(Src) 98.7 F (37.1 C) (Oral)  Resp 18  Ht 5\' 3"  (1.6 m)  Wt 54.432 kg  BMI 21.26 kg/m2  SpO2 100%  LMP 05/22/2016 Physical Exam  Constitutional: She appears well-developed and well-nourished. No distress.  HENT:  Head: Normocephalic and atraumatic.  Eyes: Conjunctivae are normal. Pupils are equal, round, and reactive to light. Right eye exhibits no discharge. Left eye exhibits no discharge. No scleral icterus.  Neck: Normal range of motion. Neck supple. No thyromegaly present.  Cardiovascular: Normal rate, regular rhythm and normal heart sounds.  Exam reveals no gallop and no friction rub.   No murmur heard. Pulmonary/Chest: Effort normal and breath sounds normal. No stridor. No respiratory distress. She has no wheezes. She has no rales.  Abdominal: Soft. Bowel sounds are normal. She exhibits no distension. There is no tenderness. There is CVA tenderness (left). There is no rebound and no guarding.  Genitourinary: There is no rash, tenderness or lesion on the right labia. There is no rash, tenderness or lesion on the left labia. Uterus is not tender. Cervix exhibits discharge (White, milky). Cervix exhibits no motion tenderness. Right adnexum displays no mass, no tenderness and no fullness. Left adnexum displays no mass, no tenderness and no fullness. No bleeding in the vagina. No foreign body around the vagina. Injury: white, milky. Vaginal discharge found.  Musculoskeletal: She exhibits no edema.  Lymphadenopathy:    She has no cervical adenopathy.  Neurological: She is alert. Coordination normal.  Skin: Skin is warm and dry. No rash noted. She is not diaphoretic. No pallor.  Psychiatric: She has a normal mood and affect.  Nursing note and vitals reviewed.   ED Course  Procedures (including critical care time)  DIAGNOSTIC STUDIES: Oxygen Saturation is 100% on RA, normal by my interpretation.    COORDINATION OF CARE: 4:50 PM Discussed treatment plan with pt at  bedside and pt agreed to plan.  Labs Review Labs Reviewed  WET PREP, GENITAL - Abnormal; Notable for the following:    Trich, Wet Prep PRESENT (*)    Clue Cells Wet Prep HPF POC PRESENT (*)    WBC, Wet Prep HPF POC MANY (*)    All other components within normal limits  URINALYSIS, ROUTINE W REFLEX MICROSCOPIC (NOT AT [facility]) - Abnormal; Notable for the following:    Color, Urine AMBER (*)    APPearance CLOUDY (*)    Specific Gravity, Urine 1.039 (*)    Ketones, ur 15 (*)    Protein, ur 30 (*)    Leukocytes, UA MODERATE (*)    All other components within normal limits  BASIC METABOLIC PANEL - Abnormal; Notable for the following:    Potassium 3.1 (*)    Glucose, Bld 101 (*)    Calcium 8.7 (*)    All other components within normal limits  URINE MICROSCOPIC-ADD ON - Abnormal; Notable for the following:    Squamous Epithelial / LPF 6-30 (*)    Bacteria, UA MANY (*)    All other components within normal limits  URINALYSIS, ROUTINE W REFLEX MICROSCOPIC (NOT AT [facility]) - Abnormal; Notable for the following:  APPearance CLOUDY (*)    Specific Gravity, Urine 1.031 (*)    Protein, ur 30 (*)    Leukocytes, UA MODERATE (*)    All other components within normal limits  URINE MICROSCOPIC-ADD ON - Abnormal; Notable for the following:    Squamous Epithelial / LPF 0-5 (*)    Bacteria, UA MANY (*)    All other components within normal limits  URINE CULTURE  PREGNANCY, URINE  CBC WITH DIFFERENTIAL/PLATELET  LIPASE, BLOOD  RPR  HIV ANTIBODY (ROUTINE TESTING)  GC/CHLAMYDIA PROBE AMP ([place]) NOT AT [facility]    Imaging Review US Renal  06/13/2016  CLINICAL DATA:  Bilateral flank pain, urinary frequency, nausea/vomiting EXAM: RENAL / URINARY TRACT ULTRASOUND COMPLETE COMPARISON:  None. FINDINGS: Right Kidney: Length: 10.1 cm.  No mass or hydronephrosis. Left Kidney: Length: 11.1 cm.  No mass or hydronephrosis. Bladder: Mildly thick-walled although underdistended. IMPRESSION: Negative renal  ultrasound. Electronically Signed   By: Charline Bills M.D.   On: 06/13/2016 19:52   I have personally reviewed and evaluated these images and lab results as part of my medical decision-making.   EKG Interpretation None      MDM   Many possible causes for patient's symptoms including UTI, Trichomonas, possible gastroenteritis. CBC unremarkable. BMP shows potassium 3.1, calcium 8.7, glucose 101. Lipase 22. Urinalysis shows moderate leukocytes, 30 protein, many bacteria. Urine pregnancy negative. Wet prep shows Trichomonas, clue cells, and many white blood cells. Renal ultrasound negative. Benign abdominal exam. Patient improved in ED following Toradol and Zofran. Flagyl, Rocephin, azithromycin given in NAD. Discharge home with Keflex for UTI. GC/Chlamydia cultures taken and pending. HIV, RPR pending. Patient advised to make sexual partners aware that they will need to be treated for Trichomonas in any other positive cultures. She is advised that she'll be called in 2-3 days or any positive results. Patient to follow-up with OB/GYN for further evaluation. Return precautions discussed. I discussed patient with Dr. Cyndie Chime who is in agreement with plan. Patient vitals stable throughout ED course and discharged in satisfactory condition.  Final diagnoses:  Non-intractable vomiting with nausea, vomiting of unspecified type  Diarrhea, unspecified type  Trichimoniasis  Hypokalemia    I personally performed the services described in this documentation, which was scribed in my presence. The recorded information has been reviewed and is accurate.  Emi Holes, PA-C 06/13/16 2252   Leta Baptist, MD 06/15/16 [phone]

## 2016-06-13 NOTE — ED Notes (Signed)
Given warm blanket 

## 2016-06-13 NOTE — ED Notes (Signed)
Pt reports "throwing up black stuff". Pt states she is also incontinent of stool at times, diarrhea.

## 2016-06-15 LAB — HIV ANTIBODY (ROUTINE TESTING W REFLEX): HIV Screen 4th Generation wRfx: NONREACTIVE

## 2016-06-15 LAB — RPR: RPR Ser Ql: NONREACTIVE

## 2016-06-15 LAB — URINE CULTURE: Culture: NO GROWTH

## 2016-06-15 LAB — GC/CHLAMYDIA PROBE AMP (~~LOC~~) NOT AT ARMC
Chlamydia: NEGATIVE
Neisseria Gonorrhea: NEGATIVE

## 2016-11-02 ENCOUNTER — Emergency Department (HOSPITAL_BASED_OUTPATIENT_CLINIC_OR_DEPARTMENT_OTHER)
Admission: EM | Admit: 2016-11-02 | Discharge: 2016-11-02 | Disposition: A | Discharge status: Home / Self Care | Payer: Medicaid Other | Attending: Emergency Medicine | Admitting: Emergency Medicine

## 2016-11-02 ENCOUNTER — Encounter (HOSPITAL_BASED_OUTPATIENT_CLINIC_OR_DEPARTMENT_OTHER): Payer: Self-pay | Admitting: *Deleted

## 2016-11-02 DIAGNOSIS — F17210 Nicotine dependence, cigarettes, uncomplicated: Secondary | ICD-10-CM | POA: Insufficient documentation

## 2016-11-02 DIAGNOSIS — N764 Abscess of vulva: Secondary | ICD-10-CM | POA: Insufficient documentation

## 2016-11-02 MED ORDER — PENTAFLUOROPROP-TETRAFLUOROETH EX AERO: INHALATION_SPRAY | Freq: Once | CUTANEOUS | Status: DC

## 2016-11-02 MED ORDER — SULFAMETHOXAZOLE-TRIMETHOPRIM 800-160 MG PO TABS: 1.0000 | ORAL_TABLET | Freq: Two times a day (BID) | ORAL | 0 refills | Status: DC

## 2016-11-02 MED ORDER — BENZOCAINE 20 % MT AERO
INHALATION_SPRAY | OROMUCOSAL | Status: AC
  Filled 2016-11-02: qty 57

## 2016-11-02 MED ORDER — IBUPROFEN 800 MG PO TABS: 800.0000 mg | ORAL_TABLET | Freq: Three times a day (TID) | ORAL | 0 refills | Status: DC | PRN

## 2016-11-02 MED ORDER — LIDOCAINE HCL (PF) 1 % IJ SOLN
10.0000 mL | Freq: Once | INTRAMUSCULAR | Status: AC
  Administered 2016-11-02: 10 mL
  Filled 2016-11-02: qty 10

## 2016-11-02 NOTE — ED Triage Notes (Signed)
Pt reports she has a boil on right labia x 1 week. Had I&D on same area last month

## 2016-11-02 NOTE — Discharge Instructions (Signed)
Read the information below.  Use the prescribed medication as directed.  Please discuss all new medications with your pharmacist.  You may return to the Emergency Department at any time for worsening condition or any new symptoms that concern you.   If you develop redness, increased swelling, worsening pain, or fevers greater than 100.4, return to the ER immediately for a recheck.

## 2016-11-02 NOTE — ED Provider Notes (Signed)
[facility] Provider Note   CSN: [id number] Arrival date & time: 11/02/16  [id number]     History   Chief Complaint Chief Complaint  Patient presents with  . Recurrent Skin Infections    HPI Christina Zavala is a 25 y.o. female.  HPI   Patient with recurrent labial and groin abscess presents with right labial abscess that has been ongoing x 1 week.  States she frequently gets abscesses and usually they pop and drain spontaneously in about 3-4 days.  This was has not drained and is becoming more painful.  Denies fevers, abdominal pain, N/V, urinary or vaginal symptoms.  Denies possibility of pregnancy.    History reviewed. No pertinent past medical history.  There are no active problems to display for this patient.   Past Surgical History:  Procedure Laterality Date  . CESAREAN SECTION      OB History    No data available       Home Medications    Prior to Admission medications   Medication Sig Start Date End Date Taking? Authorizing Provider  ibuprofen (ADVIL,MOTRIN) 800 MG tablet Take 1 tablet (800 mg total) by mouth every 8 (eight) hours as needed. 11/02/16   Trixie DredgeEmily Keyah Blizard, PA-C  sulfamethoxazole-trimethoprim (BACTRIM DS,SEPTRA DS) 800-160 MG tablet Take 1 tablet by mouth 2 (two) times daily. X 7 days 11/02/16   Trixie DredgeEmily Lajoy Vanamburg, PA-C    Family History No family history on file.  Social History Social History  Substance Use Topics  . Smoking status: Current Every Day Smoker    Packs/day: 0.50    Types: Cigarettes  . Smokeless tobacco: Never Used  . Alcohol use Yes     Comment: 2x weekly     Allergies   Tylenol [acetaminophen]   Review of Systems Review of Systems  Constitutional: Negative for activity change, appetite change, chills, fatigue and fever.  Genitourinary: Negative for dysuria, frequency, menstrual problem, urgency, vaginal bleeding and vaginal discharge.  Musculoskeletal: Negative for myalgias and neck stiffness.  Skin: Negative for  color change and rash.  Allergic/Immunologic: Negative for immunocompromised state.  Psychiatric/Behavioral: Negative for self-injury.     Physical Exam Updated Vital Signs BP 129/86 (BP Location: Left Arm)   Pulse 62   Temp 97.5 F (36.4 C) (Oral)   Resp 18   Ht 5\' 3"  (1.6 m)   Wt 54 kg   LMP 10/27/2016   SpO2 100%   BMI 21.08 kg/m   Physical Exam  Constitutional: She appears well-developed and well-nourished. No distress.  HENT:  Head: Normocephalic and atraumatic.  Neck: Neck supple.  Pulmonary/Chest: Effort normal.  Abdominal: Soft. She exhibits no distension. There is no tenderness.  Genitourinary:  Genitourinary Comments: Right labia majorum with area of tender fluctuance, no discharge.    Neurological: She is alert.  Skin: She is not diaphoretic.  Nursing note and vitals reviewed.    ED Treatments / Results  Labs (all labs ordered are listed, but only abnormal results are displayed) Labs Reviewed - No data to display  EKG  EKG Interpretation None       Radiology No results found.  Procedures Procedures (including critical care time)  INCISION AND DRAINAGE Performed by: Trixie DredgeWEST, Caydan Mctavish Consent: Verbal consent obtained. Risks and benefits: risks, benefits and alternatives were discussed Type: abscess  Body area: right labia majorum  Anesthesia: local infiltration  Incision was made with a scalpel.  Local anesthetic: lidocaine 2% no epinephrine  Anesthetic total: 4 ml  Complexity: complex Blunt dissection  to break up loculations  Drainage: purulent  Drainage amount: large  Packing material: none  Irrigated with normal saline  Patient tolerance: Patient tolerated the procedure well with no immediate complications.     Medications Ordered in ED Medications  pentafluoroprop-tetrafluoroeth (GEBAUERS) aerosol ( Topical Not Given 11/02/16 0934)  Benzocaine (HURRCAINE) 20 % mouth spray (not administered)  lidocaine (PF) (XYLOCAINE) 1 %  injection 10 mL (10 mLs Infiltration Given 11/02/16 0918)     Initial Impression / Assessment and Plan / ED Course  I have reviewed the triage vital signs and the nursing notes.  Pertinent labs & imaging results that were available during my care of the patient were reviewed by me and considered in my medical decision making (see chart for details).  Clinical Course     Afebrile, nontoxic patient with right labial abscess.  I&D in ED.    D/C home with bactrim, motrin, return precautions.  Discussed result, findings, treatment, and follow up  with patient.  Pt given return precautions.  Pt verbalizes understanding and agrees with plan.       Final Clinical Impressions(s) / ED Diagnoses   Final diagnoses:  Abscess of right genital labia    New Prescriptions New Prescriptions   IBUPROFEN (ADVIL,MOTRIN) 800 MG TABLET    Take 1 tablet (800 mg total) by mouth every 8 (eight) hours as needed.   SULFAMETHOXAZOLE-TRIMETHOPRIM (BACTRIM DS,SEPTRA DS) 800-160 MG TABLET    Take 1 tablet by mouth 2 (two) times daily. X 7 days     Trixie Dredgemily Jackelynn Hosie, PA-C 11/02/16 [id number]    Maia PlanJoshua G Long, MD 11/02/16 1001

## 2017-01-02 ENCOUNTER — Encounter (HOSPITAL_BASED_OUTPATIENT_CLINIC_OR_DEPARTMENT_OTHER): Payer: Self-pay | Admitting: *Deleted

## 2017-01-02 DIAGNOSIS — F17210 Nicotine dependence, cigarettes, uncomplicated: Secondary | ICD-10-CM | POA: Diagnosis not present

## 2017-01-02 DIAGNOSIS — N390 Urinary tract infection, site not specified: Secondary | ICD-10-CM | POA: Diagnosis not present

## 2017-01-02 DIAGNOSIS — A5909 Other urogenital trichomoniasis: Secondary | ICD-10-CM | POA: Insufficient documentation

## 2017-01-02 DIAGNOSIS — F1290 Cannabis use, unspecified, uncomplicated: Secondary | ICD-10-CM | POA: Insufficient documentation

## 2017-01-02 DIAGNOSIS — R102 Pelvic and perineal pain: Secondary | ICD-10-CM | POA: Diagnosis present

## 2017-01-02 MED ORDER — ACETAMINOPHEN 325 MG PO TABS
650.0000 mg | ORAL_TABLET | Freq: Once | ORAL | Status: AC
  Administered 2017-01-02: 650 mg via ORAL
  Filled 2017-01-02: qty 2

## 2017-01-02 NOTE — ED Triage Notes (Signed)
Bilateral lower abdominal pain.  Denies vaginal discharge.  States that she is unable to stay awake.

## 2017-01-03 ENCOUNTER — Emergency Department (HOSPITAL_BASED_OUTPATIENT_CLINIC_OR_DEPARTMENT_OTHER)
Admission: EM | Admit: 2017-01-03 | Discharge: 2017-01-03 | Disposition: A | Discharge status: Home / Self Care | Payer: Medicaid Other | Attending: Emergency Medicine | Admitting: Emergency Medicine

## 2017-01-03 DIAGNOSIS — A599 Trichomoniasis, unspecified: Secondary | ICD-10-CM

## 2017-01-03 DIAGNOSIS — N72 Inflammatory disease of cervix uteri: Secondary | ICD-10-CM

## 2017-01-03 DIAGNOSIS — N390 Urinary tract infection, site not specified: Secondary | ICD-10-CM

## 2017-01-03 LAB — URINALYSIS, ROUTINE W REFLEX MICROSCOPIC
BILIRUBIN URINE: NEGATIVE
Glucose, UA: NEGATIVE mg/dL
HGB URINE DIPSTICK: NEGATIVE
KETONES UR: NEGATIVE mg/dL
Nitrite: NEGATIVE
Protein, ur: NEGATIVE mg/dL
SPECIFIC GRAVITY, URINE: 1.023 (ref 1.005–1.030)
pH: 6 (ref 5.0–8.0)

## 2017-01-03 LAB — WET PREP, GENITAL
SPERM: NONE SEEN
TRICH WET PREP: NONE SEEN
YEAST WET PREP: NONE SEEN

## 2017-01-03 LAB — URINALYSIS, MICROSCOPIC (REFLEX)

## 2017-01-03 LAB — PREGNANCY, URINE: PREG TEST UR: NEGATIVE

## 2017-01-03 MED ORDER — LIDOCAINE HCL (PF) 1 % IJ SOLN
INTRAMUSCULAR | Status: AC
Start: 2017-01-03 — End: 2017-01-03
  Administered 2017-01-03: 0.9 mL
  Filled 2017-01-03: qty 5

## 2017-01-03 MED ORDER — CEFTRIAXONE SODIUM 250 MG IJ SOLR
250.0000 mg | Freq: Once | INTRAMUSCULAR | Status: AC
  Administered 2017-01-03: 250 mg via INTRAMUSCULAR
  Filled 2017-01-03: qty 250

## 2017-01-03 MED ORDER — AZITHROMYCIN 250 MG PO TABS
1000.0000 mg | ORAL_TABLET | Freq: Once | ORAL | Status: AC
  Administered 2017-01-03: 1000 mg via ORAL
  Filled 2017-01-03: qty 4

## 2017-01-03 MED ORDER — NITROFURANTOIN MONOHYD MACRO 100 MG PO CAPS: 100.0000 mg | ORAL_CAPSULE | Freq: Two times a day (BID) | ORAL | 0 refills | Status: DC

## 2017-01-03 MED ORDER — ONDANSETRON 8 MG PO TBDP
8.0000 mg | ORAL_TABLET | Freq: Once | ORAL | Status: AC
  Administered 2017-01-03: 8 mg via ORAL
  Filled 2017-01-03: qty 1

## 2017-01-03 MED ORDER — METRONIDAZOLE 500 MG PO TABS
2000.0000 mg | ORAL_TABLET | Freq: Once | ORAL | Status: AC
  Administered 2017-01-03: 2000 mg via ORAL
  Filled 2017-01-03: qty 4

## 2017-01-03 NOTE — ED Notes (Signed)
Crackers and peanut butter given prior to antibiotics with gingerale

## 2017-01-03 NOTE — ED Provider Notes (Signed)
[facility] Provider Note: Lowella DellJ. Lane Hermine Feria, MD, FACEP  CSN: [id number] MRN: [id number] ARRIVAL: 01/02/17 at 2307 ROOM: MH09/[facility]09   CHIEF COMPLAINT  Abdominal Pain   HISTORY OF PRESENT ILLNESS  Christina Zavala is a 26 y.o. female with a three-day history of pelvic pain. Pain is moderate and worse with movement or urination. Symptoms are similar to previous urinary tract infection. She denies vaginal bleeding or discharge. She denies nausea, vomiting or diarrhea. She denies fever or chills. She denies flank pain.   History reviewed. No pertinent past medical history.  Past Surgical History:  Procedure Laterality Date  . CESAREAN SECTION      History reviewed. No pertinent family history.  Social History  Substance Use Topics  . Smoking status: Current Every Day Smoker    Packs/day: 0.50    Types: Cigarettes  . Smokeless tobacco: Never Used  . Alcohol use Yes     Comment: 2x weekly    Prior to Admission medications   Not on File    Allergies Patient has no active allergies.   REVIEW OF SYSTEMS  Negative except as noted here or in the History of Present Illness.   PHYSICAL EXAMINATION  Initial Vital Signs Blood pressure 104/72, pulse 82, temperature 99.9 F (37.7 C), temperature source Oral, resp. rate 18, height 5\' 3"  (1.6 m), weight 120 lb (54.4 kg), last menstrual period 12/12/2016, SpO2 100 %.  Examination General: Well-developed, well-nourished female in no acute distress; appearance consistent with age of record HENT: normocephalic; atraumatic Eyes: pupils equal, round and reactive to light; extraocular muscles intact Neck: supple Heart: regular rate and rhythm Lungs: clear to auscultation bilaterally Abdomen: soft; nondistended; suprapubic tenderness; no masses or hepatosplenomegaly; bowel sounds present GU: No CVA tenderness; normal external genitalia; mucopurulent cervical and vaginal discharge; cervical motion tenderness; bilateral  adnexal tenderness Extremities: No deformity; full range of motion; pulses normal Neurologic: Awake, alert and oriented; motor function intact in all extremities and symmetric; no facial droop Skin: Warm and dry Psychiatric: Normal mood and affect   RESULTS  Summary of this visit's results, reviewed by myself:   EKG Interpretation  Date/Time:    Ventricular Rate:    PR Interval:    QRS Duration:   QT Interval:    QTC Calculation:   R Axis:     Text Interpretation:        Laboratory Studies: Results for orders placed or performed during the hospital encounter of 01/03/17 (from the past 24 hour(s))  Urinalysis, Routine w reflex microscopic     Status: Abnormal   Collection Time: 01/02/17 11:40 PM  Result Value Ref Range   Color, Urine AMBER (A) YELLOW   APPearance CLOUDY (A) CLEAR   Specific Gravity, Urine 1.023 1.005 - 1.030   pH 6.0 5.0 - 8.0   Glucose, UA NEGATIVE NEGATIVE mg/dL   Hgb urine dipstick NEGATIVE NEGATIVE   Bilirubin Urine NEGATIVE NEGATIVE   Ketones, ur NEGATIVE NEGATIVE mg/dL   Protein, ur NEGATIVE NEGATIVE mg/dL   Nitrite NEGATIVE NEGATIVE   Leukocytes, UA LARGE (A) NEGATIVE  Pregnancy, urine     Status: None   Collection Time: 01/02/17 11:40 PM  Result Value Ref Range   Preg Test, Ur NEGATIVE NEGATIVE  Urinalysis, Microscopic (reflex)     Status: Abnormal   Collection Time: 01/02/17 11:40 PM  Result Value Ref Range   RBC / HPF 0-5 0 - 5 RBC/hpf   WBC, UA TOO NUMEROUS TO COUNT 0 - 5 WBC/hpf  Bacteria, UA MANY (A) NONE SEEN   Squamous Epithelial / LPF 0-5 (A) NONE SEEN   Mucous PRESENT    Trichomonas, UA PRESENT   Wet prep, genital     Status: Abnormal   Collection Time: 01/03/17  2:57 AM  Result Value Ref Range   Yeast Wet Prep HPF POC NONE SEEN NONE SEEN   Trich, Wet Prep NONE SEEN NONE SEEN   Clue Cells Wet Prep HPF POC PRESENT (A) NONE SEEN   WBC, Wet Prep HPF POC MANY (A) NONE SEEN   Sperm NONE SEEN    Imaging Studies: No results  found.  ED COURSE  Nursing notes and initial vitals signs, including pulse oximetry, reviewed.  Vitals:   01/02/17 2340 01/02/17 2340 01/03/17 0108 01/03/17 0255  BP:  127/70 104/72 95/70  Pulse:  93 82 82  Resp:   18 18  Temp:  100.7 F (38.2 C) 99.9 F (37.7 C) 99.5 F (37.5 C)  TempSrc: Oral Oral Oral Oral  SpO2:  100% 100% 100%  Weight:  120 lb (54.4 kg)    Height:  5\' 3"  (1.6 m)     4:04 AM We will treat for cervicitis and trichomoniasis in the ED and discharge on antibiotic for urinary tract infection.  PROCEDURES    ED DIAGNOSES     ICD-9-CM ICD-10-CM   1. Cervicitis 616.0 N72   2. Trichomonal infection 131.9 A59.9   3. Lower urinary tract infection 599.0 N39.0        Paula Libra, MD 01/03/17 0405

## 2017-01-03 NOTE — ED Notes (Signed)
Pt c/o low abd pain and pain with urination x 2 days. No vaginal d/c. Hx UTI, kidney infection and trich. "Feels like same." Last BM today.

## 2017-01-03 NOTE — ED Notes (Signed)
MD with pt  

## 2017-01-04 LAB — HIV ANTIBODY (ROUTINE TESTING W REFLEX): HIV Screen 4th Generation wRfx: NONREACTIVE

## 2017-01-04 LAB — GC/CHLAMYDIA PROBE AMP (~~LOC~~) NOT AT ARMC
Chlamydia: NEGATIVE
NEISSERIA GONORRHEA: NEGATIVE

## 2017-04-23 ENCOUNTER — Encounter (HOSPITAL_BASED_OUTPATIENT_CLINIC_OR_DEPARTMENT_OTHER): Payer: Self-pay | Admitting: Emergency Medicine

## 2017-04-23 ENCOUNTER — Emergency Department (HOSPITAL_BASED_OUTPATIENT_CLINIC_OR_DEPARTMENT_OTHER)
Admission: EM | Admit: 2017-04-23 | Discharge: 2017-04-23 | Disposition: A | Discharge status: Home / Self Care | Payer: Medicaid Other | Attending: Dermatology | Admitting: Dermatology

## 2017-04-23 DIAGNOSIS — R42 Dizziness and giddiness: Secondary | ICD-10-CM | POA: Insufficient documentation

## 2017-04-23 LAB — URINALYSIS, ROUTINE W REFLEX MICROSCOPIC
Bilirubin Urine: NEGATIVE
GLUCOSE, UA: NEGATIVE mg/dL
Ketones, ur: NEGATIVE mg/dL
Nitrite: NEGATIVE
PH: 6.5 (ref 5.0–8.0)
PROTEIN: NEGATIVE mg/dL
Specific Gravity, Urine: 1.002 — ABNORMAL LOW (ref 1.005–1.030)

## 2017-04-23 LAB — URINALYSIS, MICROSCOPIC (REFLEX)

## 2017-04-23 LAB — PREGNANCY, URINE: Preg Test, Ur: POSITIVE — AB

## 2017-04-23 NOTE — ED Notes (Signed)
Pt stated she was told at [facility] she needed a blood transfusion and was told we didn't do transfusions here so she was going to another hospital.

## 2017-04-23 NOTE — ED Triage Notes (Signed)
PT presents to ED with complaints of dizzy and weakness. PT took meds to pass contents after miscarriage yesterday. PT states she passed out today and called ems and [facility] . States she was discharged. PT states she is still having vaginal bleeding and is concerned that she is bleeding to much.

## 2017-04-24 DIAGNOSIS — O039 Complete or unspecified spontaneous abortion without complication: Secondary | ICD-10-CM | POA: Insufficient documentation

## 2017-04-24 DIAGNOSIS — D500 Iron deficiency anemia secondary to blood loss (chronic): Secondary | ICD-10-CM | POA: Insufficient documentation

## 2017-07-21 ENCOUNTER — Encounter (HOSPITAL_BASED_OUTPATIENT_CLINIC_OR_DEPARTMENT_OTHER): Payer: Self-pay | Admitting: Internal Medicine

## 2017-07-21 ENCOUNTER — Emergency Department (HOSPITAL_BASED_OUTPATIENT_CLINIC_OR_DEPARTMENT_OTHER)
Admission: EM | Admit: 2017-07-21 | Discharge: 2017-07-21 | Disposition: A | Discharge status: Home / Self Care | Payer: Medicaid Other | Attending: Emergency Medicine | Admitting: Emergency Medicine

## 2017-07-21 DIAGNOSIS — Z5321 Procedure and treatment not carried out due to patient leaving prior to being seen by health care provider: Secondary | ICD-10-CM | POA: Diagnosis not present

## 2017-07-21 DIAGNOSIS — R42 Dizziness and giddiness: Secondary | ICD-10-CM | POA: Insufficient documentation

## 2017-07-21 DIAGNOSIS — R51 Headache: Secondary | ICD-10-CM | POA: Insufficient documentation

## 2017-07-21 NOTE — ED Triage Notes (Addendum)
Pt reports headache since Sunday and dizziness. Denies chest pain and shortness of breath.  Concerned about hypertension.

## 2017-07-21 NOTE — ED Notes (Signed)
Patient left AMA.  Advised patient to stay.  Patient states that she only came to ER for BP check and will leave at this time.  Advised patient that if anything worsened to come back to ER.  Patient voiced understanding.

## 2018-02-02 ENCOUNTER — Emergency Department (HOSPITAL_BASED_OUTPATIENT_CLINIC_OR_DEPARTMENT_OTHER)
Admission: EM | Admit: 2018-02-02 | Discharge: 2018-02-02 | Disposition: A | Discharge status: Home / Self Care | Payer: Medicaid Other | Attending: Emergency Medicine | Admitting: Emergency Medicine

## 2018-02-02 ENCOUNTER — Emergency Department (HOSPITAL_BASED_OUTPATIENT_CLINIC_OR_DEPARTMENT_OTHER): Payer: Medicaid Other

## 2018-02-02 ENCOUNTER — Other Ambulatory Visit: Payer: Self-pay

## 2018-02-02 ENCOUNTER — Encounter (HOSPITAL_BASED_OUTPATIENT_CLINIC_OR_DEPARTMENT_OTHER): Payer: Self-pay

## 2018-02-02 DIAGNOSIS — Y929 Unspecified place or not applicable: Secondary | ICD-10-CM | POA: Diagnosis not present

## 2018-02-02 DIAGNOSIS — Z87891 Personal history of nicotine dependence: Secondary | ICD-10-CM | POA: Diagnosis not present

## 2018-02-02 DIAGNOSIS — Y9301 Activity, walking, marching and hiking: Secondary | ICD-10-CM | POA: Diagnosis not present

## 2018-02-02 DIAGNOSIS — Y999 Unspecified external cause status: Secondary | ICD-10-CM | POA: Diagnosis not present

## 2018-02-02 DIAGNOSIS — M25572 Pain in left ankle and joints of left foot: Secondary | ICD-10-CM | POA: Diagnosis present

## 2018-02-02 DIAGNOSIS — W1840XA Slipping, tripping and stumbling without falling, unspecified, initial encounter: Secondary | ICD-10-CM | POA: Diagnosis not present

## 2018-02-02 MED ORDER — NAPROXEN 250 MG PO TABS
500.0000 mg | ORAL_TABLET | Freq: Once | ORAL | Status: AC
  Administered 2018-02-02: 500 mg via ORAL
  Filled 2018-02-02: qty 2

## 2018-02-02 MED ORDER — NAPROXEN 500 MG PO TABS: 500.0000 mg | ORAL_TABLET | Freq: Two times a day (BID) | ORAL | 0 refills | Status: DC

## 2018-02-02 MED ORDER — TRAMADOL HCL 50 MG PO TABS
50.0000 mg | ORAL_TABLET | Freq: Once | ORAL | Status: AC
  Administered 2018-02-02: 50 mg via ORAL
  Filled 2018-02-02: qty 1

## 2018-02-02 MED FILL — NAPROXEN 500 MG TABLET: 500 | 15 days supply | Qty: 30 | Fill #0

## 2018-02-02 NOTE — ED Provider Notes (Signed)
[facility] EMERGENCY DEPARTMENT Provider Note   CSN: [id number] Arrival date & time: 02/02/18  1152     History   Chief Complaint Chief Complaint  Patient presents with  . Ankle Pain    HPI Christina Zavala is a 27 y.o. female without significant past medical history who presents to the emergency department complaining of left ankle/calf pain status post injury yesterday.  Patient states that she was ambulating when she misstepped and felt a popping sensation to the posterior aspect of her left ankle.  States she has had constant pain since this incident.  Rates her pain a 10 out of 10 in severity.  Pain is worse with attempts to move the ankle as well as to bear weight, she has been unable to bear weight since the incident.  Pain without alleviating factors, she tried Tylenol last night without relief. Has noted some associated swelling. Denies numbness, weakness, erythema, ecchymosis, fever, or chills.  HPI  History reviewed. No pertinent past medical history.  There are no active problems to display for this patient.   Past Surgical History:  Procedure Laterality Date  . CESAREAN SECTION      OB History    No data available       Home Medications    Prior to Admission medications   Medication Sig Start Date End Date Taking? Authorizing Provider  promethazine (PHENERGAN) 25 MG tablet Take 1 tablet (25 mg total) by mouth every 6 (six) hours as needed for nausea or vomiting. 02/24/15 10/13/15  Molpus, Jonny RuizJohn, MD    Family History History reviewed. No pertinent family history.  Social History Social History   Tobacco Use  . Smoking status: Former Smoker    Packs/day: 1.00    Types: Cigarettes  . Smokeless tobacco: Never Used  Substance Use Topics  . Alcohol use: Yes    Comment: occ  . Drug use: No     Allergies   Patient has no known allergies.   Review of Systems Review of Systems  Constitutional: Negative for chills and fever.    Musculoskeletal: Positive for arthralgias (L ankle), joint swelling (Left ankle) and myalgias (L calf).  Skin: Negative for color change.  Neurological: Negative for weakness and numbness.   Physical Exam Updated Vital Signs BP (!) 141/95 (BP Location: Right Arm)   Pulse 78   Temp 98.2 F (36.8 C) (Oral)   Resp 18   Ht 5\' 3"  (1.6 m)   Wt 59 kg (130 lb)   LMP 01/31/2018   SpO2 100%   BMI 23.03 kg/m   Physical Exam  Constitutional: She appears well-developed and well-nourished. No distress.  HENT:  Head: Normocephalic and atraumatic.  Eyes: Conjunctivae are normal. Right eye exhibits no discharge. Left eye exhibits no discharge.  Cardiovascular:  Pulses:      Dorsalis pedis pulses are 2+ on the right side, and 2+ on the left side.  Musculoskeletal:  Extremities: There is no obvious deformity,  erythema, or ecchymosis. There is some mildsoft tissue swelling to the posterior left ankle.   Patient has normal range of motion to bilateral hips and knees as well as her right ankle.Marland Kitchen.  Her range of motion to her left ankle is significantly limited secondary to pain.  She cannot minimally dorsi and plantar flex with the left ankle.  She is able to move all of her toes. Tenderness to palpation of the left posterior calcaneus extending to achilles tendon region and calf, most prominent over the achilles  tendon. There is no point/focal bony tenderness to the lower extremities. No malleolar tenderness, tenderness to base of the 5th, navicular tenderness, or tenderness to fibular head.. minimal movement with thompson test to LLE.   Neurological: She is alert.  Clear speech.  Sensation grossly intact bilateral lower extremities.  Strength with plantar and dorsiflexion assessment limited in the left lower extremity secondary to patient's pain.  She is able to move all digits of the lower extremities.  Gait unable to assess secondary to pain as well.  Psychiatric: She has a normal mood and affect. Her  behavior is normal. Thought content normal.  Nursing note and vitals reviewed.   ED Treatments / Results  Labs (all labs ordered are listed, but only abnormal results are displayed) Labs Reviewed - No data to display  EKG  EKG Interpretation None       Radiology Dg Ankle Complete Left  Result Date: 02/02/2018 CLINICAL DATA:  Left ankle pain after injury last night. EXAM: LEFT ANKLE COMPLETE - 3+ VIEW COMPARISON:  None. FINDINGS: No acute fracture or dislocation. The talar dome is intact. The ankle mortise is symmetric. No tibiotalar joint effusion. Joint spaces are preserved. Bone mineralization is normal. Soft tissues are unremarkable. IMPRESSION: Negative. Electronically Signed   By: Obie Dredge M.D.   On: 02/02/2018 12:46    Procedures Procedures (including critical care time)  Medications Ordered in ED Medications  naproxen (NAPROSYN) tablet 500 mg (not administered)  traMADol (ULTRAM) tablet 50 mg (not administered)     Initial Impression / Assessment and Plan / ED Course  I have reviewed the triage vital signs and the nursing notes.  Pertinent labs & imaging results that were available during my care of the patient were reviewed by me and considered in my medical decision making (see chart for details).   Patient presents with left posterior ankle pain and calf pain status post popping sensation injury yesterday.  Patient's x-ray negative for fracture or dislocation.  History and physical exam somewhat suspicious for possible Achilles tendon injury.  Patient is neurovascularly intact distally.  Will place patient in a Cam walker with crutches, provide prescription for naproxen, and have her follow-up with orthopedic surgery for further evaluation. I discussed results, treatment plan, need for ortho follow-up, and return precautions with the patient. Provided opportunity for questions, patient confirmed understanding and is in agreement with plan.   Findings and plan  of care discussed with supervising physician Dr. Rubin Payor who is in agreement with plan.    Final Clinical Impressions(s) / ED Diagnoses   Final diagnoses:  Acute left ankle pain    ED Discharge Orders        Ordered    naproxen (NAPROSYN) 500 MG tablet  2 times daily     02/02/18 [address], Pleas Koch, PA-C 02/02/18 1523    Benjiman Core, MD 02/02/18 1640

## 2018-02-02 NOTE — ED Notes (Signed)
Called for pt in ED waiting room, no answer. Will try again later

## 2018-02-02 NOTE — Discharge Instructions (Signed)
Please read and follow all provided instructions.  You have been seen today for left ankle pain  Tests performed today include: An x-ray of the affected area - does NOT show any broken bones or dislocations.  Vital signs. See below for your results today.   Your physical exam is concerning for a possible injury to your Achilles tendon.  You Will need to follow-up with orthopedics in order to further evaluate this.  Home care instructions: -- *PRICE in the first 24-48 hours after injury: Protect (with brace, splint, sling), if given by your provider-remain in the walking boot provided to you until your follow-up with orthopedics. Rest Ice- Do not apply ice pack directly to your skin, place towel or similar between your skin and ice/ice pack. Apply ice for 20 min, then remove for 40 min while awake Compression- Wear brace, elastic bandage, splint as directed by your provider Elevate affected extremity above the level of your heart when not walking around for the first 24-48 hours   Medications:  Naproxen is a nonsteroidal anti-inflammatory medication that will help with pain and swelling. Be sure to take this medication as prescribed with food, 1 pill every 12 hours,  It should be taken with food, as it can cause stomach upset, and more seriously, stomach bleeding. Do not take other nonsteroidal anti-inflammatory medications with this such as Advil, Motrin, or Aleve.  You may supplement with Tylenol per over-the-counter bottle dosing instructions.   Follow-up instructions: Please follow-up with the orthopedic specialist provided in your discharge instructions in the next 3 days for reevaluation and further management.  Return instructions:  Please return if your toes or feet are numb or tingling, appear gray or blue, or you have severe pain (also elevate the leg and loosen splint or wrap if you were given one) Please return to the Emergency Department if you experience worsening symptoms.    Please return if you have any other emergent concerns. Additional Information:  Your vital signs today were: BP (!) 141/95 (BP Location: Right Arm)    Pulse 78    Temp 98.2 F (36.8 C) (Oral)    Resp 18    Ht 5\' 3"  (1.6 m)    Wt 59 kg (130 lb)    LMP 01/31/2018    SpO2 100%    BMI 23.03 kg/m  If your blood pressure (BP) was elevated above 135/85 this visit, please have this repeated by your doctor within one month. ---------------

## 2018-02-02 NOTE — ED Triage Notes (Signed)
Pt states she was walking yesterday and felt a pop to left posterior ankle area-states she is now unable to bear weight-presents to triage in w/c-NAD

## 2018-02-14 ENCOUNTER — Inpatient Hospital Stay (INDEPENDENT_AMBULATORY_CARE_PROVIDER_SITE_OTHER): Payer: Self-pay | Admitting: Orthopaedic Surgery

## 2018-03-10 ENCOUNTER — Ambulatory Visit (INDEPENDENT_AMBULATORY_CARE_PROVIDER_SITE_OTHER): Payer: Medicaid Other | Admitting: Physician Assistant

## 2018-03-10 ENCOUNTER — Ambulatory Visit (INDEPENDENT_AMBULATORY_CARE_PROVIDER_SITE_OTHER): Payer: Medicaid Other

## 2018-03-10 ENCOUNTER — Other Ambulatory Visit (INDEPENDENT_AMBULATORY_CARE_PROVIDER_SITE_OTHER): Payer: Self-pay | Admitting: Physician Assistant

## 2018-03-10 ENCOUNTER — Encounter (INDEPENDENT_AMBULATORY_CARE_PROVIDER_SITE_OTHER): Payer: Self-pay | Admitting: Physician Assistant

## 2018-03-10 DIAGNOSIS — M25572 Pain in left ankle and joints of left foot: Secondary | ICD-10-CM

## 2018-03-10 DIAGNOSIS — S86012A Strain of left Achilles tendon, initial encounter: Secondary | ICD-10-CM | POA: Diagnosis not present

## 2018-03-10 MED ORDER — TRAMADOL HCL 50 MG PO TABS: 50.0000 mg | ORAL_TABLET | Freq: Four times a day (QID) | ORAL | 0 refills | Status: DC | PRN

## 2018-03-10 NOTE — Progress Notes (Signed)
Office Visit Note   Patient: Christina Zavala           Date of Birth: 19-Feb-1991           MRN: [id number] Visit Date: 03/10/2018              Requested by: No referring provider defined for this encounter. PCP: Patient, No Pcp Per   Assessment & Plan: Visit Diagnoses:  1. Pain in left ankle and joints of left foot   2. Rupture of left Achilles tendon, initial encounter     Plan: She will continue the cam walker boot when up ambulating.  She given tramadol for pain.  Placed on the schedule for a left Achilles repair next week.  Questions encouraged and answered by Dr. Magnus IvanBlackman myself.  Meds benefits reviewed with patient.  She understands due to the fact that this rupture occurred over a month ago that this can make it more difficult repair.  Follow-Up Instructions: Return for 2 weeks post op.   Orders:  Orders Placed This Encounter  Procedures  . XR Ankle Complete Left   Meds ordered this encounter  Medications  . traMADol (ULTRAM) 50 MG tablet    Sig: Take 1 tablet (50 mg total) by mouth every 6 (six) hours as needed.    Dispense:  30 tablet    Refill:  0      Procedures: No procedures performed   Clinical Data: No additional findings.   Subjective: Chief Complaint  Patient presents with  . Left Ankle - Pain    HPI Ms. Christina Zavala is a 27 year old female who was [facility] 02/02/2018 for left ankle pain.  States she was walking a new pair of Jordans and felt something pop in the posterior aspect of her ankle.  Radiographs were obtained showed no acute fracture.  She was given a Cam walker boot , crutches and told to follow-up with orthopedic surgery for further evaluation.  She states her pain remains 10 out of 10 pain has been taking Tylenol she did try some naproxen which really did not help much. Review of Systems See HPI otherwise negative  Objective: Vital Signs: There were no vitals taken for this visit.  Physical Exam    Constitutional: She is oriented to person, place, and time. She appears well-developed and well-nourished. No distress.  Cardiovascular: Intact distal pulses.  Pulmonary/Chest: Effort normal.  Neurological: She is alert and oriented to person, place, and time.  Skin: She is not diaphoretic.  Psychiatric: She has a normal mood and affect.    Ortho Exam Left calf supple nontender.  Denna HaggardHomans is positive on the left negative on the right.  She has palpable defect in the mid to proximal left Achilles.  Minimal tenderness over the lateral medial malleoli.  No skin rashes lesions or impending ulcers.  Sensation grossly intact throughout the left foot to light touch.  She will continue her cam walker boot. Specialty Comments:  No specialty comments available.  Imaging: Xr Ankle Complete Left  Result Date: 03/10/2018 Left ankle 3 views: No acute fracture.  Talus well located within the ankle mortise no diastases.    PMFS History: There are no active problems to display for this patient.  History reviewed. No pertinent past medical history.  History reviewed. No pertinent family history.  Past Surgical History:  Procedure Laterality Date  . CESAREAN SECTION     Social History   Occupational History  . Not  on file  Tobacco Use  . Smoking status: Former Smoker    Packs/day: 1.00    Types: Cigarettes  . Smokeless tobacco: Never Used  Substance and Sexual Activity  . Alcohol use: Yes    Comment: occ  . Drug use: No  . Sexual activity: Not on file

## 2018-03-14 ENCOUNTER — Encounter (HOSPITAL_COMMUNITY): Payer: Self-pay | Admitting: Anesthesiology

## 2018-03-14 NOTE — Progress Notes (Addendum)
I left a message on [employer] voice mail stating that we have not been able to get in touch with Ms Orvan FalconerCampbell and requesting the phone numbers that the office has.  UI called and spoke to Consuello Bossierheryl Bennett, she checked for any different numbers for Ms Cambell- none were available.

## 2018-03-15 ENCOUNTER — Encounter (HOSPITAL_COMMUNITY): Payer: Self-pay | Admitting: Certified Registered"

## 2018-03-15 ENCOUNTER — Ambulatory Visit (HOSPITAL_COMMUNITY)
Admission: RE | Admit: 2018-03-15 | Discharge: 2018-03-15 | Disposition: A | Discharge status: Home / Self Care | Payer: Medicaid Other | Source: Ambulatory Visit | Attending: Orthopaedic Surgery | Admitting: Orthopaedic Surgery

## 2018-03-15 ENCOUNTER — Encounter (HOSPITAL_COMMUNITY)
Admission: RE | Disposition: A | Discharge status: Home / Self Care | Payer: Self-pay | Source: Ambulatory Visit | Attending: Orthopaedic Surgery

## 2018-03-15 DIAGNOSIS — X58XXXA Exposure to other specified factors, initial encounter: Secondary | ICD-10-CM | POA: Diagnosis not present

## 2018-03-15 DIAGNOSIS — Z791 Long term (current) use of non-steroidal anti-inflammatories (NSAID): Secondary | ICD-10-CM | POA: Insufficient documentation

## 2018-03-15 DIAGNOSIS — Z5329 Procedure and treatment not carried out because of patient's decision for other reasons: Secondary | ICD-10-CM | POA: Insufficient documentation

## 2018-03-15 DIAGNOSIS — Z87891 Personal history of nicotine dependence: Secondary | ICD-10-CM | POA: Insufficient documentation

## 2018-03-15 DIAGNOSIS — M25572 Pain in left ankle and joints of left foot: Secondary | ICD-10-CM | POA: Diagnosis present

## 2018-03-15 DIAGNOSIS — S86012A Strain of left Achilles tendon, initial encounter: Secondary | ICD-10-CM | POA: Diagnosis not present

## 2018-03-15 LAB — POCT PREGNANCY, URINE: Preg Test, Ur: NEGATIVE

## 2018-03-15 SURGERY — REPAIR, TENDON, ACHILLES
Anesthesia: General | Laterality: Left

## 2018-03-15 MED ORDER — MIDAZOLAM HCL 2 MG/2ML IJ SOLN
INTRAMUSCULAR | Status: AC
Start: 2018-03-15 — End: 2018-03-15
  Filled 2018-03-15: qty 2

## 2018-03-15 MED ORDER — PROPOFOL 10 MG/ML IV BOLUS
INTRAVENOUS | Status: AC
  Filled 2018-03-15: qty 20

## 2018-03-15 MED ORDER — FENTANYL CITRATE (PF) 250 MCG/5ML IJ SOLN
INTRAMUSCULAR | Status: AC
  Filled 2018-03-15: qty 5

## 2018-03-15 NOTE — Progress Notes (Signed)
Pt did not want to be put to sleep after talking to Dr Magnus Ivanblackman. DC to home with friend.Neg. Urine preg test.

## 2018-03-15 NOTE — H&P (Signed)
Christina Zavala is an 27 y.o. female.   Chief Complaint: left ankle pain and weakness HPI:   27 yo female who injured her left ankle about 4 weeks about.  Was placed in a cam walking boot and eventually was seen in our office.  On exam, she was found to have a complete rupture of her left achilles tendon and surgery has been recommended.  History reviewed. No pertinent past medical history.  Past Surgical History:  Procedure Laterality Date  . CESAREAN SECTION      History reviewed. No pertinent family history. Social History:  reports that she has quit smoking. Her smoking use included cigarettes. She smoked 1.00 pack per day. She has never used smokeless tobacco. She reports that she drinks alcohol. She reports that she does not use drugs.  Allergies: No Known Allergies  Medications Prior to Admission  Medication Sig Dispense Refill  . naproxen (NAPROSYN) 500 MG tablet Take 1 tablet (500 mg total) by mouth 2 (two) times daily. 30 tablet 0  . traMADol (ULTRAM) 50 MG tablet Take 1 tablet (50 mg total) by mouth every 6 (six) hours as needed. 30 tablet 0    No results found for this or any previous visit (from the past 48 hour(s)). No results found.  Review of Systems  All other systems reviewed and are negative.   There were no vitals taken for this visit. Physical Exam  Constitutional: She is oriented to person, place, and time. She appears well-developed and well-nourished.  HENT:  Head: Normocephalic and atraumatic.  Eyes: Pupils are equal, round, and reactive to light. EOM are normal.  Neck: Normal range of motion. Neck supple.  Cardiovascular: Normal rate.  Respiratory: Effort normal.  GI: Soft.  Musculoskeletal:       Left ankle: Achilles tendon exhibits pain, defect and abnormal Thompson's test results.  Neurological: She is alert and oriented to person, place, and time.  Skin: Skin is warm and dry.  Psychiatric: She has a normal mood and affect.      Assessment/Plan Complete rupture of left achilles tendon  To the OR today for a left achilles tendon repair.  Risks and benefits have been discussed in detail.  Kathryne Hitchhristopher Y Blackman, MD 03/15/2018, 2:42 PM

## 2018-03-15 NOTE — Anesthesia Preprocedure Evaluation (Deleted)
Anesthesia Evaluation  Patient identified by MRN, date of birth, ID band Patient awake    Reviewed: Allergy & Precautions, NPO status , Patient's Chart, lab work & pertinent test results  Airway Mallampati: I       Dental no notable dental hx. (+) Teeth Intact   Pulmonary former smoker,    Pulmonary exam normal breath sounds clear to auscultation       Cardiovascular negative cardio ROS Normal cardiovascular exam Rhythm:Regular Rate:Normal     Neuro/Psych negative neurological ROS  negative psych ROS   GI/Hepatic negative GI ROS, Neg liver ROS,   Endo/Other  negative endocrine ROS  Renal/GU negative Renal ROS  negative genitourinary   Musculoskeletal negative musculoskeletal ROS (+)   Abdominal Normal abdominal exam  (+)   Peds  Hematology negative hematology ROS (+)   Anesthesia Other Findings   Reproductive/Obstetrics                             Anesthesia Physical Anesthesia Plan Anesthesia Quick Evaluation

## 2018-04-05 ENCOUNTER — Other Ambulatory Visit: Payer: Self-pay

## 2018-04-05 ENCOUNTER — Encounter (HOSPITAL_BASED_OUTPATIENT_CLINIC_OR_DEPARTMENT_OTHER): Payer: Self-pay

## 2018-04-05 ENCOUNTER — Emergency Department (HOSPITAL_BASED_OUTPATIENT_CLINIC_OR_DEPARTMENT_OTHER)
Admission: EM | Admit: 2018-04-05 | Discharge: 2018-04-05 | Disposition: A | Discharge status: Home / Self Care | Payer: Medicaid Other | Attending: Emergency Medicine | Admitting: Emergency Medicine

## 2018-04-05 ENCOUNTER — Emergency Department (HOSPITAL_BASED_OUTPATIENT_CLINIC_OR_DEPARTMENT_OTHER): Payer: Medicaid Other

## 2018-04-05 DIAGNOSIS — Z3A01 Less than 8 weeks gestation of pregnancy: Secondary | ICD-10-CM | POA: Insufficient documentation

## 2018-04-05 DIAGNOSIS — Y999 Unspecified external cause status: Secondary | ICD-10-CM | POA: Diagnosis not present

## 2018-04-05 DIAGNOSIS — M79662 Pain in left lower leg: Secondary | ICD-10-CM | POA: Insufficient documentation

## 2018-04-05 DIAGNOSIS — Z79899 Other long term (current) drug therapy: Secondary | ICD-10-CM | POA: Insufficient documentation

## 2018-04-05 DIAGNOSIS — Y9389 Activity, other specified: Secondary | ICD-10-CM | POA: Insufficient documentation

## 2018-04-05 DIAGNOSIS — R109 Unspecified abdominal pain: Secondary | ICD-10-CM | POA: Diagnosis not present

## 2018-04-05 DIAGNOSIS — Z3490 Encounter for supervision of normal pregnancy, unspecified, unspecified trimester: Secondary | ICD-10-CM

## 2018-04-05 DIAGNOSIS — S39012A Strain of muscle, fascia and tendon of lower back, initial encounter: Secondary | ICD-10-CM

## 2018-04-05 DIAGNOSIS — Y92410 Unspecified street and highway as the place of occurrence of the external cause: Secondary | ICD-10-CM | POA: Diagnosis not present

## 2018-04-05 DIAGNOSIS — Z87891 Personal history of nicotine dependence: Secondary | ICD-10-CM | POA: Diagnosis not present

## 2018-04-05 DIAGNOSIS — Z3491 Encounter for supervision of normal pregnancy, unspecified, first trimester: Secondary | ICD-10-CM | POA: Diagnosis not present

## 2018-04-05 DIAGNOSIS — O9989 Other specified diseases and conditions complicating pregnancy, childbirth and the puerperium: Secondary | ICD-10-CM | POA: Diagnosis present

## 2018-04-05 HISTORY — DX: Strain of left Achilles tendon, initial encounter: S86.012A

## 2018-04-05 LAB — COMPREHENSIVE METABOLIC PANEL
ALK PHOS: 43 U/L (ref 38–126)
ALT: 19 U/L (ref 14–54)
AST: 23 U/L (ref 15–41)
Albumin: 4.5 g/dL (ref 3.5–5.0)
Anion gap: 9 (ref 5–15)
BILIRUBIN TOTAL: 0.4 mg/dL (ref 0.3–1.2)
BUN: 10 mg/dL (ref 6–20)
CALCIUM: 9.3 mg/dL (ref 8.9–10.3)
CO2: 23 mmol/L (ref 22–32)
CREATININE: 0.8 mg/dL (ref 0.44–1.00)
Chloride: 102 mmol/L (ref 101–111)
GFR calc Af Amer: >60 mL/min (ref >60)
Glucose, Bld: 91 mg/dL (ref 65–99)
Potassium: 3.4 mmol/L — ABNORMAL LOW (ref 3.5–5.1)
Sodium: 134 mmol/L — ABNORMAL LOW (ref 135–145)
TOTAL PROTEIN: 7.7 g/dL (ref 6.5–8.1)

## 2018-04-05 LAB — URINALYSIS, ROUTINE W REFLEX MICROSCOPIC
BILIRUBIN URINE: NEGATIVE
GLUCOSE, UA: NEGATIVE mg/dL
Hgb urine dipstick: NEGATIVE
KETONES UR: NEGATIVE mg/dL
Nitrite: NEGATIVE
PH: 6 (ref 5.0–8.0)
PROTEIN: NEGATIVE mg/dL
Specific Gravity, Urine: 1.02 (ref 1.005–1.030)

## 2018-04-05 LAB — CBC WITH DIFFERENTIAL/PLATELET
BASOS ABS: 0 10*3/uL (ref 0.0–0.1)
Basophils Relative: 0 %
Eosinophils Absolute: 0.2 10*3/uL (ref 0.0–0.7)
Eosinophils Relative: 2 %
HEMATOCRIT: 36.9 % (ref 36.0–46.0)
Hemoglobin: 13.1 g/dL (ref 12.0–15.0)
LYMPHS ABS: 2.9 10*3/uL (ref 0.7–4.0)
LYMPHS PCT: 28 %
MCH: 33.3 pg (ref 26.0–34.0)
MCHC: 35.5 g/dL (ref 30.0–36.0)
MCV: 93.9 fL (ref 78.0–100.0)
Monocytes Absolute: 0.7 10*3/uL (ref 0.1–1.0)
Monocytes Relative: 6 %
NEUTROS ABS: 6.7 10*3/uL (ref 1.7–7.7)
Neutrophils Relative %: 64 %
Platelets: 251 10*3/uL (ref 150–400)
RBC: 3.93 MIL/uL (ref 3.87–5.11)
RDW: 12.3 % (ref 11.5–15.5)
WBC: 10.4 10*3/uL (ref 4.0–10.5)

## 2018-04-05 LAB — HCG, QUANTITATIVE, PREGNANCY: hCG, Beta Chain, Quant, S: 6067 m[IU]/mL — ABNORMAL HIGH (ref <5)

## 2018-04-05 LAB — URINALYSIS, MICROSCOPIC (REFLEX)

## 2018-04-05 LAB — LIPASE, BLOOD: Lipase: 37 U/L (ref 11–51)

## 2018-04-05 LAB — PREGNANCY, URINE: Preg Test, Ur: POSITIVE — AB
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
## 2018-04-05 NOTE — ED Notes (Signed)
Back from [place], visiting family (pt) in other room.
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
## 2018-04-05 NOTE — ED Notes (Signed)
No changes. Alert, NAD, calm, interactive, to [place].

## 2018-04-15 ENCOUNTER — Ambulatory Visit (INDEPENDENT_AMBULATORY_CARE_PROVIDER_SITE_OTHER): Payer: Medicaid Other | Admitting: Advanced Practice Midwife

## 2018-04-15 ENCOUNTER — Other Ambulatory Visit (HOSPITAL_COMMUNITY)
Admission: RE | Admit: 2018-04-15 | Discharge: 2018-04-15 | Disposition: A | Discharge status: Home / Self Care | Payer: Medicaid Other | Source: Ambulatory Visit | Attending: Advanced Practice Midwife | Admitting: Advanced Practice Midwife

## 2018-04-15 ENCOUNTER — Encounter: Payer: Self-pay | Admitting: Advanced Practice Midwife

## 2018-04-15 DIAGNOSIS — O34219 Maternal care for unspecified type scar from previous cesarean delivery: Secondary | ICD-10-CM

## 2018-04-15 DIAGNOSIS — Z3481 Encounter for supervision of other normal pregnancy, first trimester: Secondary | ICD-10-CM | POA: Insufficient documentation

## 2018-04-15 DIAGNOSIS — Z3480 Encounter for supervision of other normal pregnancy, unspecified trimester: Secondary | ICD-10-CM

## 2018-04-15 DIAGNOSIS — Z3687 Encounter for antenatal screening for uncertain dates: Secondary | ICD-10-CM

## 2018-04-15 MED ORDER — CONCEPT DHA 53.5-38-1 MG PO CAPS: 1.0000 | ORAL_CAPSULE | Freq: Every day | ORAL | 12 refills | Status: DC

## 2018-04-15 MED FILL — TARON-C DHA CAPSULE: 53.5-38-1 | 30 days supply | Qty: 30 | Fill #0

## 2018-04-15 NOTE — Progress Notes (Signed)
Subjective:     Note in error

## 2018-04-15 NOTE — Progress Notes (Signed)
  Subjective:    Christina Zavala is being seen today for her first obstetrical visit.  This is not a planned pregnancy. She is at [redacted]w[redacted]d gestation. Her obstetrical history is significant for history of prior cesearean section, wants TOLAC. Relationship with FOB: significant other, living together. Patient does intend to breast feed. Pregnancy history fully reviewed.  Patient reports no complaints.  Review of Systems:   Review of Systems  Constitutional: Negative for chills and fever.  Respiratory: Negative for shortness of breath.   Cardiovascular: Negative for leg swelling.  Gastrointestinal: Negative for abdominal pain, constipation, diarrhea and nausea.  Genitourinary: Negative for dysuria and vaginal bleeding.  Musculoskeletal: Negative for back pain.  Neurological: Negative for dizziness and weakness.    Objective:     BP 121/65   Pulse (!) 50   Wt 131 lb (59.4 kg)   LMP 02/10/2018 (Approximate)   BMI 22.49 kg/m  Physical Exam  Constitutional: She is oriented to person, place, and time. She appears well-developed and well-nourished. No distress.  HENT:  Head: Normocephalic.  Cardiovascular: Normal rate and regular rhythm.  Respiratory: No respiratory distress.  GI: Soft. She exhibits no distension. There is no tenderness. There is no rebound and no guarding.  Genitourinary: Vagina normal and uterus normal. Vulva exhibits no lesion.  Genitourinary Comments: Pap done with cultures Discussed might spot for a few days  Musculoskeletal: Normal range of motion.  Neurological: She is alert and oriented to person, place, and time.  Skin: Skin is warm and dry.  Psychiatric: She has a normal mood and affect.    Maternal Exam:  Abdomen: Patient reports no abdominal tenderness. Surgical scars: low transverse.   Introitus: Normal vulva. Vulva is negative for lesion.  Normal vagina.  Pelvis: adequate for delivery.   Cervix: Cervix evaluated by sterile speculum exam.     Fetal  Exam Fetal Monitor Review: Mode: ultrasound.   Baseline rate: unable to detect fetal heart rate, Fetal pole measured [redacted]w[redacted]d.         Assessment:    Pregnancy: G3P1011 Patient Active Problem List   Diagnosis Date Noted  . Supervision of other normal pregnancy, antepartum 04/15/2018  . Previous cesarean delivery affecting pregnancy, antepartum 04/15/2018  . Achilles tendon tear, left, initial encounter 03/15/2018  . Iron deficiency anemia due to chronic blood loss 04/24/2017  . SAB (spontaneous abortion) 04/24/2017       Plan:     Initial labs drawn. Prenatal vitamins. Problem list reviewed and updated. AFP3 discussed: undecided. Role of ultrasound in pregnancy discussed; fetal survey: requested. Amniocentesis discussed: not indicated. Follow up in 4 weeks. 50% of 30 min visit spent on counseling and coordination of care.   Reviewed expected course of care Routines reviewed Reviewed how practice works, including staffing at hospital and learners Discussed TOLAC, desires  We did have progression of development of fetal pole but no heartbeat. Will schedule follow up US next week.    Wynelle Bourgeois 04/15/2018

## 2018-04-15 NOTE — Patient Instructions (Signed)
First Trimester of Pregnancy The first trimester of pregnancy is from week 1 until the end of week 13 (months 1 through 3). During this time, your baby will begin to develop inside you. At 6-8 weeks, the eyes and face are formed, and the heartbeat can be seen on ultrasound. At the end of 12 weeks, all the baby's organs are formed. Prenatal care is all the medical care you receive before the birth of your baby. Make sure you get good prenatal care and follow all of your doctor's instructions. Follow these instructions at home: Medicines  Take over-the-counter and prescription medicines only as told by your doctor. Some medicines are safe and some medicines are not safe during pregnancy.  Take a prenatal vitamin that contains at least 600 micrograms (mcg) of folic acid.  If you have trouble pooping (constipation), take medicine that will make your stool soft (stool softener) if your doctor approves. Eating and drinking  Eat regular, healthy meals.  Your doctor will tell you the amount of weight gain that is right for you.  Avoid raw meat and uncooked cheese.  If you feel sick to your stomach (nauseous) or throw up (vomit): ? Eat 4 or 5 small meals a day instead of 3 large meals. ? Try eating a few soda crackers. ? Drink liquids between meals instead of during meals.  To prevent constipation: ? Eat foods that are high in fiber, like fresh fruits and vegetables, whole grains, and beans. ? Drink enough fluids to keep your pee (urine) clear or pale yellow. Activity  Exercise only as told by your doctor. Stop exercising if you have cramps or pain in your lower belly (abdomen) or low back.  Do not exercise if it is too hot, too humid, or if you are in a place of great height (high altitude).  Try to avoid standing for long periods of time. Move your legs often if you must stand in one place for a long time.  Avoid heavy lifting.  Wear low-heeled shoes. Sit and stand up straight.  You  can have sex unless your doctor tells you not to. Relieving pain and discomfort  Wear a good support bra if your breasts are sore.  Take warm water baths (sitz baths) to soothe pain or discomfort caused by hemorrhoids. Use hemorrhoid cream if your doctor says it is okay.  Rest with your legs raised if you have leg cramps or low back pain.  If you have puffy, bulging veins (varicose veins) in your legs: ? Wear support hose or compression stockings as told by your doctor. ? Raise (elevate) your feet for 15 minutes, 3-4 times a day. ? Limit salt in your food. Prenatal care  Schedule your prenatal visits by the twelfth week of pregnancy.  Write down your questions. Take them to your prenatal visits.  Keep all your prenatal visits as told by your doctor. This is important. Safety  Wear your seat belt at all times when driving.  Make a list of emergency phone numbers. The list should include numbers for family, friends, the hospital, and police and fire departments. General instructions  Ask your doctor for a referral to a local prenatal class. Begin classes no later than at the start of month 6 of your pregnancy.  Ask for help if you need counseling or if you need help with nutrition. Your doctor can give you advice or tell you where to go for help.  Do not use hot tubs, steam rooms, or   saunas.  Do not douche or use tampons or scented sanitary pads.  Do not cross your legs for long periods of time.  Avoid all herbs and alcohol. Avoid drugs that are not approved by your doctor.  Do not use any tobacco products, including cigarettes, chewing tobacco, and electronic cigarettes. If you need help quitting, ask your doctor. You may get counseling or other support to help you quit.  Avoid cat litter boxes and soil used by cats. These carry germs that can cause birth defects in the baby and can cause a loss of your baby (miscarriage) or stillbirth.  Visit your dentist. At home, brush  your teeth with a soft toothbrush. Be gentle when you floss. Contact a doctor if:  You are dizzy.  You have mild cramps or pressure in your lower belly.  You have a nagging pain in your belly area.  You continue to feel sick to your stomach, you throw up, or you have watery poop (diarrhea).  You have a bad smelling fluid coming from your vagina.  You have pain when you pee (urinate).  You have increased puffiness (swelling) in your face, hands, legs, or ankles. Get help right away if:  You have a fever.  You are leaking fluid from your vagina.  You have spotting or bleeding from your vagina.  You have very bad belly cramping or pain.  You gain or lose weight rapidly.  You throw up blood. It may look like coffee grounds.  You are around people who have German measles, fifth disease, or chickenpox.  You have a very bad headache.  You have shortness of breath.  You have any kind of trauma, such as from a fall or a car accident. Summary  The first trimester of pregnancy is from week 1 until the end of week 13 (months 1 through 3).  To take care of yourself and your unborn baby, you will need to eat healthy meals, take medicines only if your doctor tells you to do so, and do activities that are safe for you and your baby.  Keep all follow-up visits as told by your doctor. This is important as your doctor will have to ensure that your baby is healthy and growing well. This information is not intended to replace advice given to you by your health care provider. Make sure you discuss any questions you have with your health care provider. Document Released: 04/27/2008 Document Revised: 11/17/2016 Document Reviewed: 11/17/2016 Elsevier Interactive Patient Education  2017 [employer]  

## 2018-04-15 NOTE — Progress Notes (Signed)
DATING AND VIABILITY SONOGRAM   Christina Zavala is a 27 y.o. year old G77P1011 with LMP Patient's last menstrual period was 02/10/2018 (approximate). which would correlate to  [redacted]w[redacted]d weeks gestation.  She has regular menstrual cycles.   She is here today for a confirmatory initial sonogram.    GESTATION: SINGLETON     FETAL ACTIVITY:          Heart rate      None visualized Fetal pole identified.     GESTATIONAL AGE AND  BIOMETRICS:  Gestational criteria: Estimated Date of Delivery: 11/17/18 by LMP now at [redacted]w[redacted]d  Previous Scans:1  GESTATIONAL SAC           1.70 cm       6-4 weeks                                                                                   AVERAGE EGA(BY THIS SCAN): 6-4 weeks  WORKING EDD( early ultrasound ):12-05-18   TECHNICIAN COMMENTS: Will recommend that she have follow up ultrasound for viability.  Did see the questionable smaller focus of fluid that was noted on [place] by imaging. No fetal pole noted in it. Will change dating to todays ultrasound due to consistent with first ultrasound.   A copy of this report including all images has been saved and backed up to a second source for retrieval if needed. All measures and details of the anatomical scan, placentation, fluid volume and pelvic anatomy are contained in that report.  Armandina Stammer 04/15/2018 10:47 AM

## 2018-04-18 LAB — CULTURE, OB URINE

## 2018-04-18 LAB — URINE CULTURE, OB REFLEX

## 2018-04-19 LAB — GC/CHLAMYDIA PROBE AMP (~~LOC~~) NOT AT ARMC
CHLAMYDIA, DNA PROBE: POSITIVE — AB
Neisseria Gonorrhea: NEGATIVE

## 2018-04-20 ENCOUNTER — Encounter: Payer: Self-pay | Admitting: Advanced Practice Midwife

## 2018-04-20 ENCOUNTER — Telehealth: Payer: Self-pay

## 2018-04-20 DIAGNOSIS — O98819 Other maternal infectious and parasitic diseases complicating pregnancy, unspecified trimester: Secondary | ICD-10-CM

## 2018-04-20 DIAGNOSIS — A749 Chlamydial infection, unspecified: Secondary | ICD-10-CM | POA: Insufficient documentation

## 2018-04-20 LAB — CYTOLOGY - PAP: Diagnosis: NEGATIVE

## 2018-04-20 MED ORDER — AZITHROMYCIN 500 MG PO TABS: 1000.0000 mg | ORAL_TABLET | Freq: Once | ORAL | 1 refills | Status: AC

## 2018-04-20 NOTE — Telephone Encounter (Signed)
Attempted to reach patient in regards to positive CHlamydia result.  Phone number states she is not accepting phone calls at this time.  Sent RX to pharmacy. Armandina Stammer RN

## 2018-04-22 ENCOUNTER — Other Ambulatory Visit: Payer: Self-pay

## 2018-04-22 DIAGNOSIS — Z3480 Encounter for supervision of other normal pregnancy, unspecified trimester: Secondary | ICD-10-CM

## 2018-04-22 NOTE — Progress Notes (Unsigned)
US

## 2018-04-23 ENCOUNTER — Encounter (HOSPITAL_BASED_OUTPATIENT_CLINIC_OR_DEPARTMENT_OTHER): Payer: Self-pay

## 2018-04-23 ENCOUNTER — Ambulatory Visit (HOSPITAL_BASED_OUTPATIENT_CLINIC_OR_DEPARTMENT_OTHER)
Admission: RE | Admit: 2018-04-23 | Discharge: 2018-04-23 | Disposition: A | Discharge status: Home / Self Care | Payer: Medicaid Other | Source: Ambulatory Visit | Attending: Advanced Practice Midwife | Admitting: Advanced Practice Midwife

## 2018-04-23 DIAGNOSIS — O34219 Maternal care for unspecified type scar from previous cesarean delivery: Secondary | ICD-10-CM

## 2018-04-23 DIAGNOSIS — Z3480 Encounter for supervision of other normal pregnancy, unspecified trimester: Secondary | ICD-10-CM

## 2018-04-25 NOTE — Telephone Encounter (Signed)
Patients phone number states that she is not accepting phone calls at this time. Will send letter to patient. Armandina StammerJennifer Vonette Grosso RN

## 2018-04-28 LAB — OBSTETRIC PANEL, INCLUDING HIV
Antibody Screen: NEGATIVE
BASOS ABS: 0 10*3/uL (ref 0.0–0.2)
Basos: 0 %
EOS (ABSOLUTE): 0.2 10*3/uL (ref 0.0–0.4)
Eos: 1 %
HIV SCREEN 4TH GENERATION: NONREACTIVE
Hematocrit: 38.9 % (ref 34.0–46.6)
Hemoglobin: 12.4 g/dL (ref 11.1–15.9)
Hepatitis B Surface Ag: NEGATIVE
Immature Grans (Abs): 0 10*3/uL (ref 0.0–0.1)
Immature Granulocytes: 0 %
LYMPHS ABS: 2.5 10*3/uL (ref 0.7–3.1)
Lymphs: 23 %
MCH: 31.2 pg (ref 26.6–33.0)
MCHC: 31.9 g/dL (ref 31.5–35.7)
MCV: 98 fL — AB (ref 79–97)
Monocytes Absolute: 0.6 10*3/uL (ref 0.1–0.9)
Monocytes: 5 %
NEUTROS ABS: 7.7 10*3/uL — AB (ref 1.4–7.0)
Neutrophils: 71 %
PLATELETS: 302 10*3/uL (ref 150–450)
RBC: 3.98 x10E6/uL (ref 3.77–5.28)
RDW: 12.8 % (ref 12.3–15.4)
RPR Ser Ql: NONREACTIVE
Rh Factor: POSITIVE
Rubella Antibodies, IGG: 1.12 index (ref >0.99)
WBC: 10.9 10*3/uL — AB (ref 3.4–10.8)

## 2018-04-28 LAB — INHERITEST SOCIETY GUIDED

## 2018-05-19 ENCOUNTER — Ambulatory Visit (INDEPENDENT_AMBULATORY_CARE_PROVIDER_SITE_OTHER): Payer: Medicaid Other | Admitting: Family Medicine

## 2018-05-19 VITALS — BP 114/68 | HR 61 | Wt 130.8 lb

## 2018-05-19 DIAGNOSIS — O98811 Other maternal infectious and parasitic diseases complicating pregnancy, first trimester: Secondary | ICD-10-CM

## 2018-05-19 DIAGNOSIS — Z3481 Encounter for supervision of other normal pregnancy, first trimester: Secondary | ICD-10-CM

## 2018-05-19 DIAGNOSIS — A749 Chlamydial infection, unspecified: Secondary | ICD-10-CM

## 2018-05-19 DIAGNOSIS — Z3480 Encounter for supervision of other normal pregnancy, unspecified trimester: Secondary | ICD-10-CM

## 2018-05-19 DIAGNOSIS — O34219 Maternal care for unspecified type scar from previous cesarean delivery: Secondary | ICD-10-CM

## 2018-05-19 MED ORDER — PRENATAL 19 PO CHEW: 1.0000 | CHEWABLE_TABLET | Freq: Every day | ORAL | 3 refills | Status: DC

## 2018-05-19 MED ORDER — AZITHROMYCIN 500 MG PO TABS: 1000.0000 mg | ORAL_TABLET | Freq: Once | ORAL | 0 refills | Status: AC

## 2018-05-19 MED FILL — AZITHROMYCIN 500 MG TABLET: 500 | 1 days supply | Qty: 2 | Fill #0

## 2018-05-19 NOTE — Progress Notes (Signed)
   PRENATAL VISIT NOTE  Subjective:  Christina Zavala is a 27 y.o. G3P1011 at 2645w3d being seen today for ongoing prenatal care.  She is currently monitored for the following issues for this low-risk pregnancy and has Achilles tendon tear, left, initial encounter; Iron deficiency anemia due to chronic blood loss; SAB (spontaneous abortion); Supervision of other normal pregnancy, antepartum; Previous cesarean delivery affecting pregnancy, antepartum; and Chlamydia infection affecting pregnancy on their problem list.  Patient reports no complaints.  Contractions: Not present. Vag. Bleeding: None.  Movement: Absent. Denies leaking of fluid.   The following portions of the patient's history were reviewed and updated as appropriate: allergies, current medications, past family history, past medical history, past social history, past surgical history and problem list. Problem list updated.  Objective:   Vitals:   05/19/18 0939  BP: 114/68  Pulse: 61  Weight: 130 lb 12.8 oz (59.3 kg)    Fetal Status: Fetal Heart Rate (bpm): 165   Movement: Absent     General:  Alert, oriented and cooperative. Patient is in no acute distress.  Skin: Skin is warm and dry. No rash noted.   Cardiovascular: Normal heart rate noted  Respiratory: Normal respiratory effort, no problems with respiration noted  Abdomen: Soft, gravid, appropriate for gestational age.  Pain/Pressure: Absent     Pelvic: Cervical exam deferred        Extremities: Normal range of motion.  Edema: None  Mental Status: Normal mood and affect. Normal behavior. Normal judgment and thought content.   Assessment and Plan:  Pregnancy: G3P1011 at 3745w3d  1. Supervision of other normal pregnancy, antepartum FHT and Fh normal  2. Previous cesarean delivery affecting pregnancy, antepartum  3. Chlamydia infection affecting pregnancy in first trimester Expedited Partner therapy. No sex for 2 weeks after both are treated.  TOC beginning of  august  Preterm labor symptoms and general obstetric precautions including but not limited to vaginal bleeding, contractions, leaking of fluid and fetal movement were reviewed in detail with the patient. Please refer to After Visit Summary for other counseling recommendations.  Return in about 1 month (around 06/16/2018).  No future appointments.  Levie HeritageJacob J Karris Deangelo, DO

## 2018-05-25 MED FILL — VITAFOL GUMMIES: 3.33-0.333- | 30 days supply | Qty: 90 | Fill #0

## 2018-06-05 ENCOUNTER — Encounter (HOSPITAL_BASED_OUTPATIENT_CLINIC_OR_DEPARTMENT_OTHER): Payer: Self-pay | Admitting: Emergency Medicine

## 2018-06-05 ENCOUNTER — Other Ambulatory Visit: Payer: Self-pay

## 2018-06-05 ENCOUNTER — Emergency Department (HOSPITAL_BASED_OUTPATIENT_CLINIC_OR_DEPARTMENT_OTHER)
Admission: EM | Admit: 2018-06-05 | Discharge: 2018-06-05 | Disposition: A | Discharge status: Home / Self Care | Payer: Medicaid Other | Attending: Emergency Medicine | Admitting: Emergency Medicine

## 2018-06-05 DIAGNOSIS — Z3A12 12 weeks gestation of pregnancy: Secondary | ICD-10-CM | POA: Insufficient documentation

## 2018-06-05 DIAGNOSIS — O23599 Infection of other part of genital tract in pregnancy, unspecified trimester: Secondary | ICD-10-CM | POA: Insufficient documentation

## 2018-06-05 DIAGNOSIS — Z87891 Personal history of nicotine dependence: Secondary | ICD-10-CM | POA: Diagnosis not present

## 2018-06-05 DIAGNOSIS — Z79899 Other long term (current) drug therapy: Secondary | ICD-10-CM | POA: Diagnosis not present

## 2018-06-05 DIAGNOSIS — L0291 Cutaneous abscess, unspecified: Secondary | ICD-10-CM

## 2018-06-05 DIAGNOSIS — N764 Abscess of vulva: Secondary | ICD-10-CM | POA: Diagnosis not present

## 2018-06-05 MED ORDER — LIDOCAINE HCL (PF) 1 % IJ SOLN
5.0000 mL | Freq: Once | INTRAMUSCULAR | Status: AC
  Administered 2018-06-05: 5 mL via INTRADERMAL
  Filled 2018-06-05: qty 5

## 2018-06-05 NOTE — ED Triage Notes (Signed)
Patient states that she has a boil to her perineal area x 1 week

## 2018-06-05 NOTE — Discharge Instructions (Signed)
You can take 1000 mg of Tylenol.  Do not exceed 4000 mg of Tylenol a day.  Apply warm compresses to the area or soak the area in warm water to help continue express drainage.   Keep the wound clean and dry. Gently wash the wound with soap and water and make sure to pat it dry.   Return to the Emergency Department if you experienced any worsening/spreading redness or swelling, fever, worsening pain, or any other worsening or concerning symptoms.

## 2018-06-05 NOTE — ED Notes (Signed)
The patient is 3 months pregnant

## 2018-06-05 NOTE — ED Provider Notes (Signed)
[facility] EMERGENCY DEPARTMENT Provider Note   CSN: [id number] Arrival date & time: 06/05/18  1606     History   Chief Complaint Chief Complaint  Patient presents with  . Abscess    HPI Christina Zavala is a 27 y.o. female who presents for evaluation of a small bump noted to her right vaginal area.  Patient states that this has been ongoing for last 4 days.  She states that she has a small area of swelling and pain.  She states that it is not draining anything.  She reports she had a similar episode a few weeks ago and states that it resolved on its own.  Patient reports that this 1 was lasting longer and was causing more pain, prompting ED visit.  She states she has not had any vaginal bleeding, vaginal discharge, fevers.  The history is provided by the patient.    Past Medical History:  Diagnosis Date  . Achilles rupture, left     Patient Active Problem List   Diagnosis Date Noted  . Chlamydia infection affecting pregnancy 04/20/2018  . Supervision of other normal pregnancy, antepartum 04/15/2018  . Previous cesarean delivery affecting pregnancy, antepartum 04/15/2018  . Achilles tendon tear, left, initial encounter 03/15/2018  . Iron deficiency anemia due to chronic blood loss 04/24/2017  . SAB (spontaneous abortion) 04/24/2017    Past Surgical History:  Procedure Laterality Date  . CESAREAN SECTION       OB History    Gravida  3   Para  1   Term  1   Preterm      AB  1   Living  1     SAB  1   TAB      Ectopic      Multiple      Live Births  1            Home Medications    Prior to Admission medications   Medication Sig Start Date End Date Taking? Authorizing Provider  Aspirin-Acetaminophen-Caffeine [phone] MG PACK Take by mouth.    [provider]  ferrous sulfate 325 (65 FE) MG EC tablet Take by mouth. 04/24/17 04/24/18  [provider]  ibuprofen (ADVIL,MOTRIN) 800 MG tablet Take by mouth. 04/26/17    [provider]  Prenatal Vit-Fe Fumarate-FA (PRENATAL 19) tablet Chew 1 tablet by mouth daily. 05/19/18   Levie Heritage, DO  traMADol (ULTRAM) 50 MG tablet Take 1 tablet (50 mg total) by mouth every 6 (six) hours as needed. Patient not taking: Reported on 04/15/2018 03/10/18   Kirtland Bouchard, PA-C    Family History History reviewed. No pertinent family history.  Social History Social History   Tobacco Use  . Smoking status: Former Smoker    Packs/day: 0.00  . Smokeless tobacco: Never Used  Substance Use Topics  . Alcohol use: Never    Frequency: Never  . Drug use: No     Allergies   Patient has no known allergies.   Review of Systems Review of Systems  Constitutional: Negative for fever.  Genitourinary: Negative for vaginal bleeding and vaginal discharge.  Skin: Positive for color change and wound.     Physical Exam Updated Vital Signs BP 112/60 (BP Location: Left Arm)   Pulse 70   Temp 97.8 F (36.6 C) (Oral)   Resp 18   Ht 5\' 3"  (1.6 m)   Wt 59 kg (130 lb)   LMP 02/10/2018 (Approximate)   SpO2 100%  BMI 23.03 kg/m   Physical Exam  Constitutional: She appears well-developed and well-nourished.  HENT:  Head: Normocephalic and atraumatic.  Eyes: Conjunctivae and EOM are normal. Right eye exhibits no discharge. Left eye exhibits no discharge. No scleral icterus.  Pulmonary/Chest: Effort normal.  Genitourinary:  Genitourinary Comments: The exam was performed with a chaperone present. Normal external female genitalia. No lesions, rash.  1 cm area of erythema with central fluctuance noted to the mons pubis on the right side.  No surrounding warmth or induration.  No rash, lesions, vesicles.  No evidence of Bartholin's abscess.  Neurological: She is alert.  Skin: Skin is warm and dry.  Psychiatric: She has a normal mood and affect. Her speech is normal and behavior is normal.  Nursing note and vitals reviewed.    ED Treatments / Results   Labs (all labs ordered are listed, but only abnormal results are displayed) Labs Reviewed - No data to display  EKG None  Radiology No results found.  Procedures .Marland KitchenIncision and Drainage Date/Time: 06/05/2018 6:34 PM Performed by: Maxwell Caul, PA-C Authorized by: Maxwell Caul, PA-C   Consent:    Consent obtained:  Verbal   Consent given by:  Patient   Risks discussed:  Incomplete drainage, bleeding, infection and pain Location:    Type:  Abscess   Location:  Anogenital   Anogenital location: Mons pubis. Pre-procedure details:    Skin preparation:  Betadine Anesthesia (see MAR for exact dosages):    Anesthesia method:  Local infiltration   Local anesthetic:  Lidocaine 1% w/o epi Procedure details:    Incision types:  Stab incision   Wound management:  Irrigated with saline   Drainage:  Bloody and purulent   Drainage amount:  Scant   Wound treatment:  Wound left open Post-procedure details:    Patient tolerance of procedure:  Tolerated well, no immediate complications   (including critical care time)  Medications Ordered in ED Medications  lidocaine (PF) (XYLOCAINE) 1 % injection 5 mL (5 mLs Intradermal Given by Other 06/05/18 1742)     Initial Impression / Assessment and Plan / ED Course  I have reviewed the triage vital signs and the nursing notes.  Pertinent labs & imaging results that were available during my care of the patient were reviewed by me and considered in my medical decision making (see chart for details).     27 year old female who presents for evaluation of painful swelling, redness noted to the right mons pubis that is been ongoing for last several days.  Reports history of similar and states that they resolved on their own but this 1 has lasted longer was causing more pain.  No fevers, vaginal bleeding, vaginal discharge.  She is currently 3 months pregnant. Patient is afebrile, non-toxic appearing, sitting comfortably on examination  table. Vital signs reviewed and stable.  On GU exam, patient has a small area that is approximately 1 cm of erythema with central fluctuance.  It looks like an ingrown hair that caused a small abscess.  No evidence of rash, lesions.  No evidence of vesicles that would be concerning for herpes.  This does not appear to be any lymphadenopathy it is actually in the mons pubis rather than the inguinal fold.  Additionally, exam is not concerning for Bartholin's gland abscess.  Discussed treatment options with patient.  Does appear that it is slightly coming to head.  Discussed with patient that this could potentially be treated at home but patient opted for  treatment here in the ED.  Incision and drainage as documented above.  Scant bloody/purulent material noted.  Wound was cleaned with sterile saline.  Dressing applied.  Patient tolerated procedure well without any difficulty.  Given lack of warmth or erythema surrounding the area, doubt cellulitis that would require antibiotic therapy at this time.  Encourage patient on supportive at home therapies. Patient had ample opportunity for questions and discussion. All patient's questions were answered with full understanding. Strict return precautions discussed. Patient expresses understanding and agreement to plan.   Final Clinical Impressions(s) / ED Diagnoses   Final diagnoses:  Abscess    ED Discharge Orders    None       Rosana HoesLayden, Kimble Hitchens A, PA-C 06/05/18 Helene [id number]    Zackowski, Scott, MD 06/05/18 2337

## 2018-06-05 NOTE — ED Notes (Signed)
ED Provider at bedside. 

## 2018-06-15 ENCOUNTER — Encounter: Payer: Medicaid Other | Admitting: Obstetrics & Gynecology

## 2018-07-12 ENCOUNTER — Encounter: Payer: Medicaid Other | Admitting: Advanced Practice Midwife

## 2018-08-03 ENCOUNTER — Encounter: Payer: Self-pay | Admitting: Family Medicine

## 2018-08-23 ENCOUNTER — Encounter: Payer: Medicaid Other | Admitting: Advanced Practice Midwife

## 2018-08-23 ENCOUNTER — Telehealth: Payer: Self-pay

## 2018-08-23 NOTE — Telephone Encounter (Signed)
Patient called and left message since she missed appt. Patient has not been seen in office since june2019

## 2018-09-16 ENCOUNTER — Ambulatory Visit (INDEPENDENT_AMBULATORY_CARE_PROVIDER_SITE_OTHER): Payer: Medicaid Other | Admitting: Family Medicine

## 2018-09-16 ENCOUNTER — Other Ambulatory Visit (HOSPITAL_COMMUNITY)
Admission: RE | Admit: 2018-09-16 | Discharge: 2018-09-16 | Disposition: A | Discharge status: Home / Self Care | Payer: Medicaid Other | Source: Ambulatory Visit | Attending: Family Medicine | Admitting: Family Medicine

## 2018-09-16 VITALS — BP 104/59 | HR 57 | Wt 147.0 lb

## 2018-09-16 DIAGNOSIS — Z3480 Encounter for supervision of other normal pregnancy, unspecified trimester: Secondary | ICD-10-CM | POA: Diagnosis not present

## 2018-09-16 DIAGNOSIS — O0933 Supervision of pregnancy with insufficient antenatal care, third trimester: Secondary | ICD-10-CM

## 2018-09-16 DIAGNOSIS — O34219 Maternal care for unspecified type scar from previous cesarean delivery: Secondary | ICD-10-CM

## 2018-09-16 NOTE — Progress Notes (Signed)
   PRENATAL VISIT NOTE  Subjective:  Christina Zavala is a 27 y.o. G3P1011 at [redacted]w[redacted]d being seen today for ongoing prenatal care.  She is currently monitored for the following issues for this high-risk pregnancy and has Achilles tendon tear, left, initial encounter; Iron deficiency anemia due to chronic blood loss; SAB (spontaneous abortion); Supervision of other normal pregnancy, antepartum; Previous cesarean delivery affecting pregnancy, antepartum; and Chlamydia infection affecting pregnancy on their problem list.  Patient reports not having a ride in order to make her appointments.  Contractions: Not present. Vag. Bleeding: None.  Movement: Present. Denies leaking of fluid.   The following portions of the patient's history were reviewed and updated as appropriate: allergies, current medications, past family history, past medical history, past social history, past surgical history and problem list. Problem list updated.  Objective:   Vitals:   09/16/18 0934  BP: (!) 104/59  Pulse: (!) 57  Weight: 147 lb (66.7 kg)    Fetal Status: Fetal Heart Rate (bpm): 152 Fundal Height: 29 cm Movement: Present     General:  Alert, oriented and cooperative. Patient is in no acute distress.  Skin: Skin is warm and dry. No rash noted.   Cardiovascular: Normal heart rate noted  Respiratory: Normal respiratory effort, no problems with respiration noted  Abdomen: Soft, gravid, appropriate for gestational age.  Pain/Pressure: Present     Pelvic: Cervical exam deferred        Extremities: Normal range of motion.  Edema: None  Mental Status: Normal mood and affect. Normal behavior. Normal judgment and thought content.   Assessment and Plan:  Pregnancy: G3P1011 at [redacted]w[redacted]d  1. Supervision of other normal pregnancy, antepartum FHT and FH normal. Needs 28 week labs - patient cannot stay today. Will have patient come back next week for labs. - [place] MFM OB DETAIL +14 WK; Future - GC/Chlamydia probe amp ([facility])not at [facility]  2. Insufficient prenatal care in third trimester Discussed that medicaid will provide transportation. Patient states that she has transportation now. Discussed importance of prenatal care and blood work  3. Previous cesarean delivery affecting pregnancy, antepartum   Preterm labor symptoms and general obstetric precautions including but not limited to vaginal bleeding, contractions, leaking of fluid and fetal movement were reviewed in detail with the patient. Please refer to After Visit Summary for other counseling recommendations.  No follow-ups on file.  Future Appointments  Date Time Provider Department Center  09/19/2018  8:30 AM [facility] [place] 1 [facility]  09/22/2018  8:30 AM [facility] NURSE [facility] None  09/29/2018  9:30 AM Levie Heritage, DO [facility] None    Levie Heritage, DO

## 2018-09-19 ENCOUNTER — Other Ambulatory Visit: Payer: Self-pay | Admitting: Family Medicine

## 2018-09-19 ENCOUNTER — Ambulatory Visit (HOSPITAL_COMMUNITY)
Admission: RE | Admit: 2018-09-19 | Discharge: 2018-09-19 | Disposition: A | Discharge status: Home / Self Care | Payer: Medicaid Other | Source: Ambulatory Visit | Attending: Family Medicine | Admitting: Family Medicine

## 2018-09-19 DIAGNOSIS — Z3A29 29 weeks gestation of pregnancy: Secondary | ICD-10-CM | POA: Diagnosis not present

## 2018-09-19 DIAGNOSIS — Z3480 Encounter for supervision of other normal pregnancy, unspecified trimester: Secondary | ICD-10-CM

## 2018-09-19 DIAGNOSIS — Z363 Encounter for antenatal screening for malformations: Secondary | ICD-10-CM | POA: Diagnosis not present

## 2018-09-19 DIAGNOSIS — O34219 Maternal care for unspecified type scar from previous cesarean delivery: Secondary | ICD-10-CM

## 2018-09-19 DIAGNOSIS — Z3483 Encounter for supervision of other normal pregnancy, third trimester: Secondary | ICD-10-CM | POA: Insufficient documentation

## 2018-09-20 ENCOUNTER — Other Ambulatory Visit (HOSPITAL_COMMUNITY): Payer: Self-pay | Admitting: *Deleted

## 2018-09-20 DIAGNOSIS — Z362 Encounter for other antenatal screening follow-up: Secondary | ICD-10-CM

## 2018-09-20 LAB — GC/CHLAMYDIA PROBE AMP (~~LOC~~) NOT AT ARMC
Chlamydia: NEGATIVE
Neisseria Gonorrhea: NEGATIVE

## 2018-09-22 ENCOUNTER — Other Ambulatory Visit: Payer: Medicaid Other

## 2018-09-29 ENCOUNTER — Ambulatory Visit (INDEPENDENT_AMBULATORY_CARE_PROVIDER_SITE_OTHER): Payer: Medicaid Other | Admitting: Family Medicine

## 2018-09-29 VITALS — BP 105/68 | HR 56 | Wt 148.0 lb

## 2018-09-29 DIAGNOSIS — O34219 Maternal care for unspecified type scar from previous cesarean delivery: Secondary | ICD-10-CM

## 2018-09-29 DIAGNOSIS — O0933 Supervision of pregnancy with insufficient antenatal care, third trimester: Secondary | ICD-10-CM

## 2018-09-29 DIAGNOSIS — Z3480 Encounter for supervision of other normal pregnancy, unspecified trimester: Secondary | ICD-10-CM

## 2018-09-29 NOTE — Progress Notes (Signed)
   PRENATAL VISIT NOTE  Subjective:  Christina Zavala is a 27 y.o. G3P1011 at [redacted]w[redacted]d being seen today for ongoing prenatal care.  She is currently monitored for the following issues for this high-risk pregnancy and has Achilles tendon tear, left, initial encounter; Iron deficiency anemia due to chronic blood loss; SAB (spontaneous abortion); Supervision of other normal pregnancy, antepartum; Previous cesarean delivery affecting pregnancy, antepartum; and Chlamydia infection affecting pregnancy on their problem list.  Patient reports no complaints.  Contractions: Irritability. Vag. Bleeding: None.  Movement: Present. Denies leaking of fluid.   The following portions of the patient's history were reviewed and updated as appropriate: allergies, current medications, past family history, past medical history, past social history, past surgical history and problem list. Problem list updated.  Objective:   Vitals:   09/29/18 0843  BP: 105/68  Pulse: (!) 56  Weight: 148 lb (67.1 kg)    Fetal Status: Fetal Heart Rate (bpm): 156   Movement: Present     General:  Alert, oriented and cooperative. Patient is in no acute distress.  Skin: Skin is warm and dry. No rash noted.   Cardiovascular: Normal heart rate noted  Respiratory: Normal respiratory effort, no problems with respiration noted  Abdomen: Soft, gravid, appropriate for gestational age.  Pain/Pressure: Present     Pelvic: Cervical exam deferred        Extremities: Normal range of motion.  Edema: None  Mental Status: Normal mood and affect. Normal behavior. Normal judgment and thought content.   Assessment and Plan:  Pregnancy: G3P1011 at [redacted]w[redacted]d  1. Supervision of other normal pregnancy, antepartum FHT and FH normal - CBC - Glucose Tolerance, 2 Hours w/1 Hour - HIV Antibody (routine testing w rflx) - RPR  2. Previous cesarean delivery affecting pregnancy, antepartum Desires RLTCS  3. Insufficient prenatal care in third  trimester   Preterm labor symptoms and general obstetric precautions including but not limited to vaginal bleeding, contractions, leaking of fluid and fetal movement were reviewed in detail with the patient. Please refer to After Visit Summary for other counseling recommendations.  Return in about 2 weeks (around 10/13/2018).  Future Appointments  Date Time Provider Department Center  09/29/2018  9:30 AM Levie Heritage, DO [facility] None  10/17/2018 12:45 PM [facility] [place] 2 [facility]    Levie Heritage, DO

## 2018-09-30 ENCOUNTER — Encounter (HOSPITAL_COMMUNITY): Payer: Self-pay

## 2018-09-30 LAB — CBC
HEMATOCRIT: 32.5 % — AB (ref 34.0–46.6)
HEMOGLOBIN: 10.9 g/dL — AB (ref 11.1–15.9)
MCH: 32.8 pg (ref 26.6–33.0)
MCHC: 33.5 g/dL (ref 31.5–35.7)
MCV: 98 fL — ABNORMAL HIGH (ref 79–97)
Platelets: 209 10*3/uL (ref 150–450)
RBC: 3.32 x10E6/uL — ABNORMAL LOW (ref 3.77–5.28)
RDW: 12.4 % (ref 12.3–15.4)
WBC: 9.6 10*3/uL (ref 3.4–10.8)

## 2018-09-30 LAB — GLUCOSE TOLERANCE, 2 HOURS W/ 1HR
GLUCOSE, 1 HOUR: 141 mg/dL (ref 65–179)
GLUCOSE, 2 HOUR: 99 mg/dL (ref 65–152)
Glucose, Fasting: 70 mg/dL (ref 65–91)

## 2018-09-30 LAB — RPR: RPR Ser Ql: NONREACTIVE

## 2018-09-30 LAB — HIV ANTIBODY (ROUTINE TESTING W REFLEX): HIV SCREEN 4TH GENERATION: NONREACTIVE

## 2018-10-05 ENCOUNTER — Telehealth: Payer: Self-pay

## 2018-10-05 NOTE — Telephone Encounter (Signed)
Pt called the after hours nurse line at 6:40pm on 10/04/18 stating that she was leaking white fluid. She also stated that she was feeling dizzy and having sharp pain on the right side of chest that comes and goes.The after hours triage nurse advised pt to go to ED.   I called pt today 10/05/18 to check to see how pt was doing. Pt states that she was told to take iron and now she feels better.Pt states that baby is moving well and that she is no longer having pain. Advised pt to go to [facility] if she starts to have pain or notices any more leaking of fluids. Understanding was voiced.

## 2018-10-06 ENCOUNTER — Encounter: Payer: Self-pay | Admitting: Family Medicine

## 2018-10-13 ENCOUNTER — Ambulatory Visit (INDEPENDENT_AMBULATORY_CARE_PROVIDER_SITE_OTHER): Payer: Medicaid Other | Admitting: Family Medicine

## 2018-10-13 VITALS — BP 101/69 | HR 83 | Wt 149.0 lb

## 2018-10-13 DIAGNOSIS — Z3480 Encounter for supervision of other normal pregnancy, unspecified trimester: Secondary | ICD-10-CM

## 2018-10-13 DIAGNOSIS — O34219 Maternal care for unspecified type scar from previous cesarean delivery: Secondary | ICD-10-CM

## 2018-10-13 DIAGNOSIS — Z3483 Encounter for supervision of other normal pregnancy, third trimester: Secondary | ICD-10-CM

## 2018-10-13 MED ORDER — PRENATAL 19 PO CHEW: 1.0000 | CHEWABLE_TABLET | Freq: Every day | ORAL | 3 refills | Status: DC

## 2018-10-13 NOTE — Progress Notes (Signed)
   PRENATAL VISIT NOTE  Subjective:  Christina Zavala is a 27 y.o. G3P1011 at 1759w3d being seen today for ongoing prenatal care.  She is currently monitored for the following issues for this high-risk pregnancy and has Achilles tendon tear, left, initial encounter; Iron deficiency anemia due to chronic blood loss; SAB (spontaneous abortion); Supervision of other normal pregnancy, antepartum; Previous cesarean delivery affecting pregnancy, antepartum; and Chlamydia infection affecting pregnancy on their problem list.  Patient reports no complaints.  Contractions: Not present. Vag. Bleeding: None.  Movement: Present. Denies leaking of fluid.   The following portions of the patient's history were reviewed and updated as appropriate: allergies, current medications, past family history, past medical history, past social history, past surgical history and problem list. Problem list updated.  Objective:   Vitals:   10/13/18 0938  BP: 101/69  Pulse: 83  Weight: 149 lb (67.6 kg)    Fetal Status: Fetal Heart Rate (bpm): 144   Movement: Present     General:  Alert, oriented and cooperative. Patient is in no acute distress.  Skin: Skin is warm and dry. No rash noted.   Cardiovascular: Normal heart rate noted  Respiratory: Normal respiratory effort, no problems with respiration noted  Abdomen: Soft, gravid, appropriate for gestational age.  Pain/Pressure: Present     Pelvic: Cervical exam deferred        Extremities: Normal range of motion.  Edema: None  Mental Status: Normal mood and affect. Normal behavior. Normal judgment and thought content.   Assessment and Plan:  Pregnancy: G3P1011 at 8859w3d  1. Supervision of other normal pregnancy, antepartum FHT and FH normal  2. Previous cesarean delivery affecting pregnancy, antepartum Repeat c/s scheduled at 39 weeks  Preterm labor symptoms and general obstetric precautions including but not limited to vaginal bleeding, contractions, leaking of  fluid and fetal movement were reviewed in detail with the patient. Please refer to After Visit Summary for other counseling recommendations.  Return in about 2 weeks (around 10/27/2018).  Future Appointments  Date Time Provider Department Center  10/17/2018 12:45 PM [facility] [place] 2 [facility]    Levie HeritageJacob J , DO

## 2018-10-17 ENCOUNTER — Encounter (HOSPITAL_COMMUNITY): Payer: Self-pay

## 2018-10-17 ENCOUNTER — Ambulatory Visit (HOSPITAL_COMMUNITY): Payer: Medicaid Other

## 2018-10-17 ENCOUNTER — Ambulatory Visit (HOSPITAL_COMMUNITY): Payer: Medicaid Other | Attending: Family Medicine

## 2018-10-25 ENCOUNTER — Encounter: Payer: Medicaid Other | Admitting: Obstetrics & Gynecology

## 2018-10-26 ENCOUNTER — Encounter (HOSPITAL_BASED_OUTPATIENT_CLINIC_OR_DEPARTMENT_OTHER): Payer: Self-pay | Admitting: *Deleted

## 2018-10-26 ENCOUNTER — Emergency Department (HOSPITAL_BASED_OUTPATIENT_CLINIC_OR_DEPARTMENT_OTHER)
Admission: EM | Admit: 2018-10-26 | Discharge: 2018-10-26 | Disposition: A | Discharge status: Home / Self Care | Payer: Medicaid Other | Attending: Emergency Medicine | Admitting: Emergency Medicine

## 2018-10-26 ENCOUNTER — Other Ambulatory Visit: Payer: Self-pay

## 2018-10-26 DIAGNOSIS — Z87891 Personal history of nicotine dependence: Secondary | ICD-10-CM | POA: Diagnosis not present

## 2018-10-26 DIAGNOSIS — O23593 Infection of other part of genital tract in pregnancy, third trimester: Secondary | ICD-10-CM | POA: Insufficient documentation

## 2018-10-26 DIAGNOSIS — Z3A35 35 weeks gestation of pregnancy: Secondary | ICD-10-CM | POA: Diagnosis not present

## 2018-10-26 DIAGNOSIS — O3463 Maternal care for abnormality of vagina, third trimester: Secondary | ICD-10-CM | POA: Diagnosis present

## 2018-10-26 DIAGNOSIS — A599 Trichomoniasis, unspecified: Secondary | ICD-10-CM

## 2018-10-26 DIAGNOSIS — O23513 Infections of cervix in pregnancy, third trimester: Secondary | ICD-10-CM | POA: Diagnosis not present

## 2018-10-26 DIAGNOSIS — B9689 Other specified bacterial agents as the cause of diseases classified elsewhere: Secondary | ICD-10-CM

## 2018-10-26 DIAGNOSIS — N760 Acute vaginitis: Secondary | ICD-10-CM

## 2018-10-26 DIAGNOSIS — N72 Inflammatory disease of cervix uteri: Secondary | ICD-10-CM

## 2018-10-26 LAB — WET PREP, GENITAL
Sperm: NONE SEEN
YEAST WET PREP: NONE SEEN

## 2018-10-26 LAB — URINALYSIS, MICROSCOPIC (REFLEX)

## 2018-10-26 LAB — URINALYSIS, ROUTINE W REFLEX MICROSCOPIC
BILIRUBIN URINE: NEGATIVE
Glucose, UA: NEGATIVE mg/dL
Hgb urine dipstick: NEGATIVE
KETONES UR: NEGATIVE mg/dL
NITRITE: NEGATIVE
PROTEIN: NEGATIVE mg/dL
Specific Gravity, Urine: 1.01 (ref 1.005–1.030)
pH: 8 (ref 5.0–8.0)

## 2018-10-26 MED ORDER — METRONIDAZOLE 500 MG PO TABS
2000.0000 mg | ORAL_TABLET | Freq: Once | ORAL | Status: AC
  Administered 2018-10-26: 2000 mg via ORAL
  Filled 2018-10-26: qty 4

## 2018-10-26 MED ORDER — METRONIDAZOLE 500 MG PO TABS: 500.0000 mg | ORAL_TABLET | Freq: Two times a day (BID) | ORAL | 0 refills | Status: DC

## 2018-10-26 MED ORDER — LIDOCAINE HCL (PF) 1 % IJ SOLN
INTRAMUSCULAR | Status: AC
  Administered 2018-10-26: 1.2 mL
  Filled 2018-10-26: qty 5

## 2018-10-26 MED ORDER — CEFTRIAXONE SODIUM 250 MG IJ SOLR
250.0000 mg | Freq: Once | INTRAMUSCULAR | Status: AC
  Administered 2018-10-26: 250 mg via INTRAMUSCULAR
  Filled 2018-10-26: qty 250

## 2018-10-26 MED ORDER — AZITHROMYCIN 250 MG PO TABS
1000.0000 mg | ORAL_TABLET | Freq: Once | ORAL | Status: AC
  Administered 2018-10-26: 1000 mg via ORAL
  Filled 2018-10-26: qty 4

## 2018-10-26 MED FILL — metroNIDAZOLE 500 MG TABS: 500 | 7 days supply | Qty: 14 | Fill #0

## 2018-10-26 NOTE — ED Provider Notes (Signed)
[facility] EMERGENCY DEPARTMENT Provider Note   CSN: [id number] Arrival date & time: 10/26/18  1013     History   Chief Complaint Chief Complaint  Patient presents with  . Vaginal Discharge    HPI Christina Zavala is a 27 y.o. female.  Pt presents to the ED today with green vaginal d/c.  She is [redacted] weeks pregnant and is scheduled for a csection on 11/28/18.  The pt sees Dr. Adrian Blackwater for her prenatal care.  The pt denies any bloody discharge.  She does c/o "tightness" in her abdomen.  The pt did have chlamydia in her first trimester.  She and her partner were treated.     Past Medical History:  Diagnosis Date  . Achilles rupture, left     Patient Active Problem List   Diagnosis Date Noted  . Chlamydia infection affecting pregnancy 04/20/2018  . Supervision of other normal pregnancy, antepartum 04/15/2018  . Previous cesarean delivery affecting pregnancy, antepartum 04/15/2018  . Achilles tendon tear, left, initial encounter 03/15/2018  . Iron deficiency anemia due to chronic blood loss 04/24/2017  . SAB (spontaneous abortion) 04/24/2017    Past Surgical History:  Procedure Laterality Date  . CESAREAN SECTION       OB History    Gravida  3   Para  1   Term  1   Preterm      AB  1   Living  1     SAB  1   TAB      Ectopic      Multiple      Live Births  1            Home Medications    Prior to Admission medications   Medication Sig Start Date End Date Taking? Authorizing Provider  Aspirin-Acetaminophen-Caffeine [phone] MG PACK Take by mouth.    [provider]  ferrous sulfate 325 (65 FE) MG EC tablet Take by mouth. 04/24/17 04/24/18  [provider]  ibuprofen (ADVIL,MOTRIN) 800 MG tablet Take by mouth. 04/26/17   [provider]  metroNIDAZOLE (FLAGYL) 500 MG tablet Take 1 tablet (500 mg total) by mouth 2 (two) times daily. 10/26/18   Jacalyn Lefevre, MD  Prenatal Vit-Fe Fumarate-FA (PRENATAL 19) tablet  Chew 1 tablet by mouth daily. 10/13/18   Levie Heritage, DO    Family History No family history on file.  Social History Social History   Tobacco Use  . Smoking status: Former Smoker    Packs/day: 0.00  . Smokeless tobacco: Never Used  Substance Use Topics  . Alcohol use: Never    Frequency: Never  . Drug use: No     Allergies   Patient has no known allergies.   Review of Systems Review of Systems  Genitourinary: Positive for vaginal discharge.  All other systems reviewed and are negative.    Physical Exam Updated Vital Signs BP 111/75   Pulse 61   Temp 97.6 F (36.4 C) (Oral)   Resp 14   Ht 5\' 4"  (1.626 m)   Wt 67.6 kg   LMP 02/10/2018 (Approximate)   SpO2 100%   BMI 25.58 kg/m   Physical Exam  Constitutional: She is oriented to person, place, and time. She appears well-developed and well-nourished.  HENT:  Head: Normocephalic and atraumatic.  Right Ear: External ear normal.  Left Ear: External ear normal.  Nose: Nose normal.  Mouth/Throat: Oropharynx is clear and moist.  Eyes: Pupils are equal, round, and reactive  to light. Conjunctivae and EOM are normal.  Neck: Normal range of motion. Neck supple.  Cardiovascular: Normal rate, regular rhythm, normal heart sounds and intact distal pulses.  Pulmonary/Chest: Effort normal and breath sounds normal.  Abdominal: Soft. Bowel sounds are normal.  Gravid abd  Genitourinary: Cervix exhibits discharge. Vaginal discharge found.  Genitourinary Comments: Large amt of foul-smelling, greenish d/c.  Musculoskeletal: Normal range of motion.  Neurological: She is alert and oriented to person, place, and time.  Skin: Skin is warm and dry. Capillary refill takes less than 2 seconds.  Psychiatric: She has a normal mood and affect. Her behavior is normal. Judgment and thought content normal.  Nursing note and vitals reviewed.    ED Treatments / Results  Labs (all labs ordered are listed, but only abnormal results  are displayed) Labs Reviewed  WET PREP, GENITAL - Abnormal; Notable for the following components:      Result Value   Trich, Wet Prep PRESENT (*)    Clue Cells Wet Prep HPF POC PRESENT (*)    WBC, Wet Prep HPF POC MANY (*)    All other components within normal limits  URINALYSIS, ROUTINE W REFLEX MICROSCOPIC - Abnormal; Notable for the following components:   Leukocytes, UA LARGE (*)    All other components within normal limits  URINALYSIS, MICROSCOPIC (REFLEX) - Abnormal; Notable for the following components:   Bacteria, UA FEW (*)    Trichomonas, UA PRESENT (*)    All other components within normal limits  GC/CHLAMYDIA PROBE AMP ([place]) NOT AT [facility]    EKG None  Radiology No results found.  Procedures Procedures (including critical care time)  Medications Ordered in ED Medications  cefTRIAXone (ROCEPHIN) injection 250 mg (250 mg Intramuscular Given 10/26/18 1154)  azithromycin (ZITHROMAX) tablet 1,000 mg (1,000 mg Oral Given 10/26/18 1154)  lidocaine (PF) (XYLOCAINE) 1 % injection (1.2 mLs  Given 10/26/18 1154)  metroNIDAZOLE (FLAGYL) tablet 2,000 mg (2,000 mg Oral Given 10/26/18 1157)     Initial Impression / Assessment and Plan / ED Course  I have reviewed the triage vital signs and the nursing notes.  Pertinent labs & imaging results that were available during my care of the patient were reviewed by me and considered in my medical decision making (see chart for details).    Rapid OB response called upon pt's arrival.  FHTs 150s.  Pt monitored and baby is ok.  Due to greenish d/c, I will treat her for STI with rocephin and zithromax.  She also has trich and BV, so was treated with flagyl.   I told her partner that he also needs treatment.  Leukocytes in urine are likely from her vagina.  Final Clinical Impressions(s) / ED Diagnoses   Final diagnoses:  Cervicitis  [redacted] weeks gestation of pregnancy  Trichomoniasis  BV (bacterial vaginosis)    ED Discharge  Orders         Ordered    metroNIDAZOLE (FLAGYL) 500 MG tablet  2 times daily     10/26/18 1201           Jacalyn LefevreHaviland, Phoebe Marter, MD 10/26/18 1201

## 2018-10-26 NOTE — Progress Notes (Signed)
NST reactive for 34 weeks.  Cleared by OB.  Keep appt with Clinic on 10/28/18.

## 2018-10-26 NOTE — Progress Notes (Addendum)
G3P1 at 34 2/7 weeks reports to [facility] for white to green discharge and pubic pain.  [facility] RN called the Charge on L+D and gave report.  Report was then transferred to the [facility] RN. Patient placed in OBIX.  Dr Ashok PallWouk updated on pt symptoms.  Orders for wet prep, GC/Clymidia, Urine.  May D/C FM after reactive NST for 34 weeks.

## 2018-10-26 NOTE — ED Notes (Signed)
ED Provider at bedside. 

## 2018-10-26 NOTE — ED Triage Notes (Signed)
Pt states that she is due with her second child on 11/28/2018.  She can not recall her LMP.  She has been having prenatal care here in the building but does not recall OB name.  Pt reports that she began having white discharge 2 weeks ago and was seen by her OB and everything was found to be normal.  Pt reports that the discharge has been green since Friday.  Pt states that she began having pain Friday.  Pt states that she has been feeling her baby move and denies any fever or chills.  Pt describes the pain as sharp pain and above her clitoris, not contraction pain. Pt was ambulatory on arrival.  Pt reports 3 pregnancies, one child (8yo) and one miscarriage last year and then this pregnancy.

## 2018-10-26 NOTE — ED Notes (Signed)
FHR 151

## 2018-10-26 NOTE — ED Notes (Signed)
[place].  She said baby look good.  No contractions. Recommended monitoring pt for another 10 minutes. She will also continue to monitor and call me back again.

## 2018-10-27 LAB — GC/CHLAMYDIA PROBE AMP (~~LOC~~) NOT AT ARMC
Chlamydia: NEGATIVE
Neisseria Gonorrhea: NEGATIVE

## 2018-10-28 ENCOUNTER — Ambulatory Visit (INDEPENDENT_AMBULATORY_CARE_PROVIDER_SITE_OTHER): Payer: Medicaid Other | Admitting: Family Medicine

## 2018-10-28 VITALS — BP 117/75 | HR 56 | Wt 152.0 lb

## 2018-10-28 DIAGNOSIS — D500 Iron deficiency anemia secondary to blood loss (chronic): Secondary | ICD-10-CM

## 2018-10-28 DIAGNOSIS — Z3483 Encounter for supervision of other normal pregnancy, third trimester: Secondary | ICD-10-CM

## 2018-10-28 DIAGNOSIS — Z3A34 34 weeks gestation of pregnancy: Secondary | ICD-10-CM

## 2018-10-28 DIAGNOSIS — O34219 Maternal care for unspecified type scar from previous cesarean delivery: Secondary | ICD-10-CM

## 2018-10-28 DIAGNOSIS — Z3480 Encounter for supervision of other normal pregnancy, unspecified trimester: Secondary | ICD-10-CM

## 2018-10-28 NOTE — Progress Notes (Signed)
   PRENATAL VISIT NOTE  Subjective:  Christina Zavala is a 27 y.o. G3P1011 at 7055w4d being seen today for ongoing prenatal care.  She is currently monitored for the following issues for this low-risk pregnancy and has Achilles tendon tear, left, initial encounter; Iron deficiency anemia due to chronic blood loss; SAB (spontaneous abortion); Supervision of other normal pregnancy, antepartum; Previous cesarean delivery affecting pregnancy, antepartum; and Chlamydia infection affecting pregnancy on their problem list.  Patient reports occasional contractions.  Contractions: Irritability. Vag. Bleeding: None.  Movement: Present. Denies leaking of fluid.   The following portions of the patient's history were reviewed and updated as appropriate: allergies, current medications, past family history, past medical history, past social history, past surgical history and problem list. Problem list updated.  Objective:   Vitals:   10/28/18 0859  BP: 117/75  Pulse: (!) 56  Weight: 152 lb 0.6 oz (69 kg)    Fetal Status: Fetal Heart Rate (bpm): 151 Fundal Height: 34 cm Movement: Present     General:  Alert, oriented and cooperative. Patient is in no acute distress.  Skin: Skin is warm and dry. No rash noted.   Cardiovascular: Normal heart rate noted  Respiratory: Normal respiratory effort, no problems with respiration noted  Abdomen: Soft, gravid, appropriate for gestational age.  Pain/Pressure: Present     Pelvic: Cervical exam deferred        Extremities: Normal range of motion.  Edema: Trace  Mental Status: Normal mood and affect. Normal behavior. Normal judgment and thought content.   Assessment and Plan:  Pregnancy: G3P1011 at 4455w4d  1. Supervision of other normal pregnancy, antepartum FHT and FH normal  2. Previous cesarean delivery affecting pregnancy, antepartum RLTCS scheduled  3. Iron deficiency anemia due to chronic blood loss   Preterm labor symptoms and general obstetric  precautions including but not limited to vaginal bleeding, contractions, leaking of fluid and fetal movement were reviewed in detail with the patient. Please refer to After Visit Summary for other counseling recommendations.  No follow-ups on file.  No future appointments.  Levie HeritageJacob J Yair Dusza, DO

## 2018-11-11 ENCOUNTER — Ambulatory Visit (INDEPENDENT_AMBULATORY_CARE_PROVIDER_SITE_OTHER): Payer: Medicaid Other | Admitting: Obstetrics & Gynecology

## 2018-11-11 ENCOUNTER — Other Ambulatory Visit (HOSPITAL_COMMUNITY)
Admission: RE | Admit: 2018-11-11 | Discharge: 2018-11-11 | Disposition: A | Discharge status: Home / Self Care | Payer: Medicaid Other | Source: Ambulatory Visit | Attending: Obstetrics & Gynecology | Admitting: Obstetrics & Gynecology

## 2018-11-11 VITALS — BP 130/74 | Wt 152.0 lb

## 2018-11-11 DIAGNOSIS — O34219 Maternal care for unspecified type scar from previous cesarean delivery: Secondary | ICD-10-CM

## 2018-11-11 DIAGNOSIS — D500 Iron deficiency anemia secondary to blood loss (chronic): Secondary | ICD-10-CM

## 2018-11-11 DIAGNOSIS — Z3480 Encounter for supervision of other normal pregnancy, unspecified trimester: Secondary | ICD-10-CM | POA: Diagnosis present

## 2018-11-11 DIAGNOSIS — Z3483 Encounter for supervision of other normal pregnancy, third trimester: Secondary | ICD-10-CM

## 2018-11-11 DIAGNOSIS — O98813 Other maternal infectious and parasitic diseases complicating pregnancy, third trimester: Secondary | ICD-10-CM

## 2018-11-11 DIAGNOSIS — O98819 Other maternal infectious and parasitic diseases complicating pregnancy, unspecified trimester: Secondary | ICD-10-CM

## 2018-11-11 DIAGNOSIS — A749 Chlamydial infection, unspecified: Secondary | ICD-10-CM

## 2018-11-11 LAB — OB RESULTS CONSOLE GC/CHLAMYDIA: GC PROBE AMP, GENITAL: NEGATIVE

## 2018-11-11 LAB — OB RESULTS CONSOLE GBS: GBS: NEGATIVE

## 2018-11-11 NOTE — Progress Notes (Signed)
   PRENATAL VISIT NOTE  Subjective:  Christina Zavala is a 27 y.o. G3P1011 at 955w4d being seen today for ongoing prenatal care.  She is currently monitored for the following issues for this low-risk pregnancy and has Achilles tendon tear, left, initial encounter; Iron deficiency anemia due to chronic blood loss; SAB (spontaneous abortion); Supervision of other normal pregnancy, antepartum; Previous cesarean delivery affecting pregnancy, antepartum; and Chlamydia infection affecting pregnancy on their problem list.  Patient reports occ ctx adn back pain.  Contractions: Irritability. Vag. Bleeding: None.  Movement: Present. Denies leaking of fluid.   The following portions of the patient's history were reviewed and updated as appropriate: allergies, current medications, past family history, past medical history, past social history, past surgical history and problem list. Problem list updated.  Objective:   Vitals:   11/11/18 0910  BP: 130/74  Weight: 152 lb (68.9 kg)    Fetal Status: Fetal Heart Rate (bpm): 135   Movement: Present     General:  Alert, oriented and cooperative. Patient is in no acute distress.  Skin: Skin is warm and dry. No rash noted.   Cardiovascular: Normal heart rate noted  Respiratory: Normal respiratory effort, no problems with respiration noted  Abdomen: Soft, gravid, appropriate for gestational age.  Pain/Pressure: Present     Pelvic: Cervical exam performed        Extremities: Normal range of motion.  Edema: Trace  Mental Status: Normal mood and affect. Normal behavior. Normal judgment and thought content.   Assessment and Plan:  Pregnancy: G3P1011 at 7155w4d  1. Supervision of other normal pregnancy, antepartum  - GC/Chlamydia probe amp ([place])not at [facility] - Culture, beta strep (group b only)  2. Previous cesarean delivery affecting pregnancy, antepartum TOLAC form, signed today No op note seen. Will request again.    3. Iron deficiency anemia  due to chronic blood loss   4. Chlamydia infection affecting pregnancy, antepartum cx repeated today  Preterm labor symptoms and general obstetric precautions including but not limited to vaginal bleeding, contractions, leaking of fluid and fetal movement were reviewed in detail with the patient. Please refer to After Visit Summary for other counseling recommendations.  Return in about 1 week (around 11/18/2018).  No future appointments.  Willodean Rosenthalarolyn Harraway-Smith, MD

## 2018-11-14 ENCOUNTER — Telehealth (HOSPITAL_COMMUNITY): Payer: Self-pay | Admitting: *Deleted

## 2018-11-14 LAB — GC/CHLAMYDIA PROBE AMP (~~LOC~~) NOT AT ARMC
CHLAMYDIA, DNA PROBE: NEGATIVE
Neisseria Gonorrhea: NEGATIVE

## 2018-11-14 NOTE — Telephone Encounter (Signed)
Preadmission screen  

## 2018-11-15 LAB — CULTURE, BETA STREP (GROUP B ONLY): Strep Gp B Culture: NEGATIVE

## 2018-11-17 ENCOUNTER — Telehealth (HOSPITAL_COMMUNITY): Payer: Self-pay | Admitting: *Deleted

## 2018-11-17 ENCOUNTER — Encounter: Payer: Medicaid Other | Admitting: Family Medicine

## 2018-11-17 NOTE — Telephone Encounter (Signed)
Preadmission screen  

## 2018-11-18 ENCOUNTER — Telehealth (HOSPITAL_COMMUNITY): Payer: Self-pay | Admitting: *Deleted

## 2018-11-18 NOTE — Telephone Encounter (Signed)
Preadmission screen  

## 2018-11-21 ENCOUNTER — Telehealth (HOSPITAL_COMMUNITY): Payer: Self-pay | Admitting: *Deleted

## 2018-11-21 NOTE — Telephone Encounter (Signed)
Preadmission screen  

## 2018-11-22 ENCOUNTER — Telehealth (HOSPITAL_COMMUNITY): Payer: Self-pay | Admitting: *Deleted

## 2018-11-22 NOTE — Telephone Encounter (Signed)
Preadmission screen  

## 2018-11-23 NOTE — L&D Delivery Note (Addendum)
Patient: Christina Zavala MRN: [id number]  GBS status: negative  Patient is a 28 y.o. now G3P2011 s/p VBAC at [redacted]w[redacted]d, who was admitted for IOL for IUFD, severe preE, DIC. AROM 3h 106m prior to delivery with clear fluid.    Delivery Note At 7:03 PM a non-viable female was delivered via VBAC, Spontaneous (Presentation: cephalic; OA).  APGAR: 0, 0; weight: pending.   Placenta status: intact, 3-vessel cord, to pathology.  Cord:  with the following complications: none.    Head delivered OA. No nuchal cord present. Shoulder and body delivered in usual fashion. Infant delivered deceased, and taken to the warmer. Cord clamped x 2 and cut by family member. Placenta delivered spontaneously with gentle cord traction.  Bimanual massage initiated immediately after delivery of the placenta. Cytotec 1000mg  administered per rectum. Blood products administered included 1 unit PRBCs, 1 unit cryoprecipitate, 1 unit platelets, 1 unit fresh frozen plasma, with 2 units PRBCs to follow. Additional uterotonics include hemabate IM x 1 and pitocin 20mg  IM x 1.  She also received phenylephrine x 2 and albumin for blood pressure support. A bakari baloon was placed and filled with of fluid and 2 18x18 laps were placed. Perineum inspected and found to have bilateral vaginal wall lacerations, which were repaired with 2.0-vicryl, however continued to have slow bleeding, and were subsequently packed with an 18x18 vaginal lap for a total of 3 laps with the patient.    Anesthesia:  epidural Episiotomy: None Lacerations: Vaginal Suture Repair: 2.0 vicryl Est. Blood Loss (mL): 2500 Augmentation: AROM, oxytocin, foley  Mom to AICU.  Baby to [place].  [name] Ob Fellow 11/28/2018, 8:41 PM   Attestation of Attending Supervision of OB Fellow: Evaluation and management procedures were performed by the [facility] Fellow under my supervision and collaboration.  I have reviewed the OB Fellow's note and chart, and I agree with  the management and plan.  In to room to check patient, she was found to be 10/100/+1. She pushed very effectively and delivered a non-viable female infant as above. Cord clamped and cut and infant taken to warmer per patient's wishes. Placenta delivered spontaneously intact with large amount (~1L) of dark red clot. She received IM pitocin, cytotec, hemabate as above. Fundus firmed with massage however she continued to ooze and bakri balloon placed with 250, then 300 mL NS in it. Bakri subsequently deflated and repositioned and 480 mL NS inserted into balloon with apparently good tamponade as bleeding slowed to ooze. 3 laps placed in vagina to assist in positioning of bakri, as well as to tamponade bilateral and posterior vaginal wall lacerations with some oozing after repair with 2-0 vicryl.   Patient received 1 unit pRBC, 1 pool cryo, 1 pack platelets, 1 FFP during the immediate post partum period. 2 additional units pRBCS ordered and are in room for administration. She will receive 2 g ancef for ppx.     Baldemar Lenis, M.D. Attending Center for [employer] (Faculty Practice)  11/28/2018 9:43 PM

## 2018-11-24 ENCOUNTER — Telehealth (HOSPITAL_COMMUNITY): Payer: Self-pay | Admitting: *Deleted

## 2018-11-24 NOTE — Telephone Encounter (Signed)
Preadmission screen  

## 2018-11-25 ENCOUNTER — Encounter (HOSPITAL_COMMUNITY)
Admission: RE | Admit: 2018-11-25 | Discharge: 2018-11-25 | Disposition: A | Discharge status: Home / Self Care | Payer: Medicaid Other | Source: Ambulatory Visit

## 2018-11-25 NOTE — Patient Instructions (Addendum)
Christina Zavala  11/25/2018   Your procedure is scheduled on:  11/28/2018  [facility] 0730 AM.  Pick up the phone at the desk and dial [phone]  Call this number if you have problems the morning of surgery:[phone]  Remember:   Do not eat food:(After Midnight) Desps de medianoche.  Do not drink clear liquids: (After Midnight) Desps de medianoche.  Take these medicines the morning of surgery with A SIP OF WATER: none   Do not wear jewelry, make-up or nail polish.  Do not wear lotions, powders, or perfumes. Do not wear deodorant.  Do not shave 48 hours prior to surgery.  Do not bring valuables to the hospital.  [facility] is not   responsible for any belongings or valuables brought to the hospital.  Contacts, dentures or bridgework may not be worn into surgery.  Leave suitcase in the car. After surgery it may be brought to your room.  For patients admitted to the hospital, checkout time is 11:00 AM the day of              discharge.    N/A   Please read over the following fact sheets that you were given:   Surgical Site Infection Prevention

## 2018-11-26 ENCOUNTER — Encounter (HOSPITAL_COMMUNITY): Payer: Self-pay | Admitting: Anesthesiology

## 2018-11-26 NOTE — Anesthesia Preprocedure Evaluation (Deleted)
Anesthesia Evaluation    Reviewed: Allergy & Precautions, Patient's Chart, lab work & pertinent test results  Airway        Dental   Pulmonary neg pulmonary ROS, former smoker,           Cardiovascular negative cardio ROS       Neuro/Psych negative neurological ROS     GI/Hepatic negative GI ROS, Neg liver ROS,   Endo/Other  negative endocrine ROS  Renal/GU negative Renal ROS     Musculoskeletal negative musculoskeletal ROS (+)   Abdominal   Peds  Hematology negative hematology ROS (+)   Anesthesia Other Findings Day of surgery medications reviewed with the patient.  Reproductive/Obstetrics                             Anesthesia Physical Anesthesia Plan  ASA: II  Anesthesia Plan: Spinal   Post-op Pain Management:    Induction:   PONV Risk Score and Plan: 3 and Ondansetron, Dexamethasone and Scopolamine patch - Pre-op  Airway Management Planned: Simple Face Mask and Natural Airway  Additional Equipment:   Intra-op Plan:   Post-operative Plan:   Informed Consent:   Plan Discussed with:   Anesthesia Plan Comments:         Anesthesia Quick Evaluation

## 2018-11-27 ENCOUNTER — Encounter (HOSPITAL_COMMUNITY): Payer: Self-pay | Admitting: Certified Registered Nurse Anesthetist

## 2018-11-27 ENCOUNTER — Inpatient Hospital Stay (HOSPITAL_COMMUNITY)
Admission: AD | Admit: 2018-11-27 | Discharge: 2018-12-01 | DRG: 768 | Disposition: A | Discharge status: Home / Self Care | Payer: Medicaid Other | Attending: Obstetrics and Gynecology | Admitting: Obstetrics and Gynecology

## 2018-11-27 ENCOUNTER — Inpatient Hospital Stay (HOSPITAL_BASED_OUTPATIENT_CLINIC_OR_DEPARTMENT_OTHER): Payer: Medicaid Other

## 2018-11-27 DIAGNOSIS — O34211 Maternal care for low transverse scar from previous cesarean delivery: Secondary | ICD-10-CM | POA: Diagnosis present

## 2018-11-27 DIAGNOSIS — O1490 Unspecified pre-eclampsia, unspecified trimester: Secondary | ICD-10-CM | POA: Diagnosis present

## 2018-11-27 DIAGNOSIS — O34219 Maternal care for unspecified type scar from previous cesarean delivery: Secondary | ICD-10-CM | POA: Diagnosis present

## 2018-11-27 DIAGNOSIS — O45023 Premature separation of placenta with disseminated intravascular coagulation, third trimester: Secondary | ICD-10-CM | POA: Diagnosis present

## 2018-11-27 DIAGNOSIS — R109 Unspecified abdominal pain: Secondary | ICD-10-CM | POA: Diagnosis not present

## 2018-11-27 DIAGNOSIS — Z87891 Personal history of nicotine dependence: Secondary | ICD-10-CM

## 2018-11-27 DIAGNOSIS — D500 Iron deficiency anemia secondary to blood loss (chronic): Secondary | ICD-10-CM | POA: Diagnosis present

## 2018-11-27 DIAGNOSIS — Z3A38 38 weeks gestation of pregnancy: Secondary | ICD-10-CM | POA: Diagnosis not present

## 2018-11-27 DIAGNOSIS — O364XX0 Maternal care for intrauterine death, not applicable or unspecified: Secondary | ICD-10-CM | POA: Diagnosis present

## 2018-11-27 DIAGNOSIS — Z98891 History of uterine scar from previous surgery: Secondary | ICD-10-CM

## 2018-11-27 DIAGNOSIS — Z3A39 39 weeks gestation of pregnancy: Secondary | ICD-10-CM

## 2018-11-27 DIAGNOSIS — O4590 Premature separation of placenta, unspecified, unspecified trimester: Secondary | ICD-10-CM | POA: Diagnosis present

## 2018-11-27 DIAGNOSIS — O1414 Severe pre-eclampsia complicating childbirth: Secondary | ICD-10-CM | POA: Diagnosis present

## 2018-11-27 DIAGNOSIS — O45029 Premature separation of placenta with disseminated intravascular coagulation, unspecified trimester: Secondary | ICD-10-CM | POA: Diagnosis present

## 2018-11-27 DIAGNOSIS — O9989 Other specified diseases and conditions complicating pregnancy, childbirth and the puerperium: Secondary | ICD-10-CM

## 2018-11-27 DIAGNOSIS — O9902 Anemia complicating childbirth: Secondary | ICD-10-CM | POA: Diagnosis present

## 2018-11-27 NOTE — MAU Note (Signed)
PT reporting pain that started around 1600.  Having constant pain on left side.   Scheduled C/S in a.m.   Unable to get FHR w/fetal U/S  2315 Provider at bedside w/U/S.    2320- Called for U/S tech to bring B/S U/S   2330- called CRNA to get IV after 2 RN tries.  Dr Alysia Penna and Dr Earlene Plater at bedside.    Pt transferred to L&D for delivery.

## 2018-11-27 NOTE — MAU Provider Note (Signed)
  History     CSN: [id number]  Arrival date and time: 11/27/18 2303  Chief Complaint  Patient presents with  . Abdominal Pain   HPI Christina Zavala is a 28 y.o. G3P1011 at [redacted]w[redacted]d who presents with abdominal pain. She states at 1600 she started having pain in her abdomen that she rates a 10/10. She denies any leaking or bleeding. Reports normal fetal movement.   OB History    Gravida  3   Para  1   Term  1   Preterm      AB  1   Living  1     SAB  1   TAB      Ectopic      Multiple      Live Births  1           Past Medical History:  Diagnosis Date  . Achilles rupture, left     Past Surgical History:  Procedure Laterality Date  . CESAREAN SECTION      No family history on file.  Social History   Tobacco Use  . Smoking status: Former Smoker    Packs/day: 0.00  . Smokeless tobacco: Never Used  Substance Use Topics  . Alcohol use: Never    Frequency: Never  . Drug use: No    Allergies: No Known Allergies  Medications Prior to Admission  Medication Sig Dispense Refill Last Dose  . metroNIDAZOLE (FLAGYL) 500 MG tablet Take 1 tablet (500 mg total) by mouth 2 (two) times daily. 14 tablet 0 Taking  . Prenatal Vit-Fe Fumarate-FA (PRENATAL 19) tablet Chew 1 tablet by mouth daily. 90 tablet 3 Taking    Review of Systems  Constitutional: Negative.  Negative for fatigue and fever.  HENT: Negative.   Respiratory: Negative.  Negative for shortness of breath.   Cardiovascular: Negative.  Negative for chest pain.  Gastrointestinal: Positive for abdominal pain. Negative for constipation, diarrhea, nausea and vomiting.  Genitourinary: Negative.  Negative for dysuria, vaginal bleeding and vaginal discharge.  Neurological: Negative.  Negative for dizziness and headaches.   Physical Exam   Blood pressure (!) 147/90, pulse 77, temperature (!) 97.4 F (36.3 C), temperature source Oral, resp. rate 18, last menstrual period 02/10/2018.  Physical Exam   Nursing note and vitals reviewed. Constitutional: She is oriented to person, place, and time. She appears well-developed and well-nourished. No distress.  HENT:  Head: Normocephalic.  Eyes: Pupils are equal, round, and reactive to light.  Cardiovascular: Normal rate, regular rhythm and normal heart sounds.  Respiratory: Effort normal and breath sounds normal. No respiratory distress.  GI: Soft. Bowel sounds are normal. She exhibits no distension. There is no abdominal tenderness.  Abdomen firm and tender  Neurological: She is alert and oriented to person, place, and time.  Skin: Skin is warm and dry.  Psychiatric: She has a normal mood and affect. Her behavior is normal. Judgment and thought content normal.    MAU Course  Procedures  MDM No fetal heart tones heard Limited bedside u/s performed by CNM and no FHT seen Stat bedside u/s ordered and confirmed IUFD- suspected large placental abruption  Dr. Alysia Penna called to bedside to evaluate patient  Assessment and Plan   1. IUFD at 20 weeks or more of gestation   2. [redacted] weeks gestation of pregnancy    -Admit to birthing suites -Care turned over to Dr. Dorene Sorrow Leone Payor CNM 11/27/2018, 11:36 PM

## 2018-11-28 ENCOUNTER — Encounter (HOSPITAL_COMMUNITY): Payer: Self-pay

## 2018-11-28 ENCOUNTER — Encounter (HOSPITAL_COMMUNITY): Admission: RE | Payer: Self-pay | Source: Home / Self Care

## 2018-11-28 ENCOUNTER — Other Ambulatory Visit: Payer: Self-pay

## 2018-11-28 ENCOUNTER — Inpatient Hospital Stay (HOSPITAL_COMMUNITY): Payer: Medicaid Other | Admitting: Anesthesiology

## 2018-11-28 ENCOUNTER — Inpatient Hospital Stay (HOSPITAL_COMMUNITY)
Admission: RE | Admit: 2018-11-28 | Payer: Medicaid Other | Source: Home / Self Care | Admitting: Obstetrics and Gynecology

## 2018-11-28 DIAGNOSIS — O9902 Anemia complicating childbirth: Secondary | ICD-10-CM | POA: Diagnosis present

## 2018-11-28 DIAGNOSIS — Z3A39 39 weeks gestation of pregnancy: Secondary | ICD-10-CM

## 2018-11-28 DIAGNOSIS — O364XX0 Maternal care for intrauterine death, not applicable or unspecified: Secondary | ICD-10-CM | POA: Diagnosis present

## 2018-11-28 DIAGNOSIS — D500 Iron deficiency anemia secondary to blood loss (chronic): Secondary | ICD-10-CM | POA: Diagnosis present

## 2018-11-28 DIAGNOSIS — O1414 Severe pre-eclampsia complicating childbirth: Secondary | ICD-10-CM | POA: Diagnosis present

## 2018-11-28 DIAGNOSIS — R109 Unspecified abdominal pain: Secondary | ICD-10-CM | POA: Diagnosis present

## 2018-11-28 DIAGNOSIS — O45023 Premature separation of placenta with disseminated intravascular coagulation, third trimester: Secondary | ICD-10-CM | POA: Diagnosis present

## 2018-11-28 DIAGNOSIS — Z87891 Personal history of nicotine dependence: Secondary | ICD-10-CM | POA: Diagnosis not present

## 2018-11-28 DIAGNOSIS — O34211 Maternal care for low transverse scar from previous cesarean delivery: Secondary | ICD-10-CM | POA: Diagnosis present

## 2018-11-28 LAB — COMPREHENSIVE METABOLIC PANEL
ALT: 10 U/L (ref 0–44)
ALT: 10 U/L (ref 0–44)
ALT: 10 U/L (ref 0–44)
ALT: 12 U/L (ref 0–44)
ALT: 14 U/L (ref 0–44)
ALT: 6 U/L (ref 0–44)
ALT: 10 U/L (ref 0–44)
ALT: 12 U/L (ref 0–44)
ALT: 12 U/L (ref 0–44)
AST: 32 U/L (ref 15–41)
AST: 46 U/L — ABNORMAL HIGH (ref 15–41)
AST: 36 U/L (ref 15–41)
AST: 38 U/L (ref 15–41)
AST: 43 U/L — ABNORMAL HIGH (ref 15–41)
AST: 52 U/L — ABNORMAL HIGH (ref 15–41)
AST: 50 U/L — ABNORMAL HIGH (ref 15–41)
AST: 52 U/L — ABNORMAL HIGH (ref 15–41)
AST: 26 U/L (ref 15–41)
Albumin: 3.1 g/dL — ABNORMAL LOW (ref 3.5–5.0)
Albumin: 3.7 g/dL (ref 3.5–5.0)
Albumin: 3 g/dL — ABNORMAL LOW (ref 3.5–5.0)
Albumin: 3.1 g/dL — ABNORMAL LOW (ref 3.5–5.0)
Albumin: 3.3 g/dL — ABNORMAL LOW (ref 3.5–5.0)
Albumin: 4.1 g/dL (ref 3.5–5.0)
Albumin: 3.2 g/dL — ABNORMAL LOW (ref 3.5–5.0)
Albumin: 3 g/dL — ABNORMAL LOW (ref 3.5–5.0)
Albumin: 2.9 g/dL — ABNORMAL LOW (ref 3.5–5.0)
Alkaline Phosphatase: 203 U/L — ABNORMAL HIGH (ref 38–126)
Alkaline Phosphatase: 325 U/L — ABNORMAL HIGH (ref 38–126)
Alkaline Phosphatase: 323 U/L — ABNORMAL HIGH (ref 38–126)
Alkaline Phosphatase: 396 U/L — ABNORMAL HIGH (ref 38–126)
Alkaline Phosphatase: 479 U/L — ABNORMAL HIGH (ref 38–126)
Alkaline Phosphatase: 587 U/L — ABNORMAL HIGH (ref 38–126)
Alkaline Phosphatase: 585 U/L — ABNORMAL HIGH (ref 38–126)
Alkaline Phosphatase: 630 U/L — ABNORMAL HIGH (ref 38–126)
Alkaline Phosphatase: 285 U/L — ABNORMAL HIGH (ref 38–126)
Anion gap: 8 (ref 5–15)
Anion gap: 10 (ref 5–15)
Anion gap: 8 (ref 5–15)
Anion gap: 7 (ref 5–15)
Anion gap: 9 (ref 5–15)
Anion gap: 12 (ref 5–15)
Anion gap: 7 (ref 5–15)
Anion gap: 8 (ref 5–15)
Anion gap: 12 (ref 5–15)
BUN: 10 mg/dL (ref 6–20)
BUN: 10 mg/dL (ref 6–20)
BUN: 10 mg/dL (ref 6–20)
BUN: 10 mg/dL (ref 6–20)
BUN: 10 mg/dL (ref 6–20)
BUN: 8 mg/dL (ref 6–20)
BUN: 8 mg/dL (ref 6–20)
BUN: 9 mg/dL (ref 6–20)
BUN: 9 mg/dL (ref 6–20)
CALCIUM: 8.8 mg/dL — AB (ref 8.9–10.3)
CALCIUM: 7.7 mg/dL — AB (ref 8.9–10.3)
CO2: 21 mmol/L — ABNORMAL LOW (ref 22–32)
CO2: 21 mmol/L — ABNORMAL LOW (ref 22–32)
CO2: 21 mmol/L — ABNORMAL LOW (ref 22–32)
CO2: 20 mmol/L — ABNORMAL LOW (ref 22–32)
CO2: 20 mmol/L — ABNORMAL LOW (ref 22–32)
CO2: 18 mmol/L — ABNORMAL LOW (ref 22–32)
CO2: 20 mmol/L — ABNORMAL LOW (ref 22–32)
CO2: 19 mmol/L — ABNORMAL LOW (ref 22–32)
CO2: 11 mmol/L — ABNORMAL LOW (ref 22–32)
Calcium: 8.1 mg/dL — ABNORMAL LOW (ref 8.9–10.3)
Calcium: 8.1 mg/dL — ABNORMAL LOW (ref 8.9–10.3)
Calcium: 8 mg/dL — ABNORMAL LOW (ref 8.9–10.3)
Calcium: 8 mg/dL — ABNORMAL LOW (ref 8.9–10.3)
Calcium: 8.4 mg/dL — ABNORMAL LOW (ref 8.9–10.3)
Calcium: 7.5 mg/dL — ABNORMAL LOW (ref 8.9–10.3)
Calcium: 4.9 mg/dL — CL (ref 8.9–10.3)
Chloride: 103 mmol/L (ref 98–111)
Chloride: 100 mmol/L (ref 98–111)
Chloride: 100 mmol/L (ref 98–111)
Chloride: 103 mmol/L (ref 98–111)
Chloride: 103 mmol/L (ref 98–111)
Chloride: 102 mmol/L (ref 98–111)
Chloride: 103 mmol/L (ref 98–111)
Chloride: 103 mmol/L (ref 98–111)
Chloride: 108 mmol/L (ref 98–111)
Creatinine, Ser: 1.21 mg/dL — ABNORMAL HIGH (ref 0.44–1.00)
Creatinine, Ser: 1.13 mg/dL — ABNORMAL HIGH (ref 0.44–1.00)
Creatinine, Ser: 1.12 mg/dL — ABNORMAL HIGH (ref 0.44–1.00)
Creatinine, Ser: 1.28 mg/dL — ABNORMAL HIGH (ref 0.44–1.00)
Creatinine, Ser: 1.23 mg/dL — ABNORMAL HIGH (ref 0.44–1.00)
Creatinine, Ser: 1.17 mg/dL — ABNORMAL HIGH (ref 0.44–1.00)
Creatinine, Ser: 1.3 mg/dL — ABNORMAL HIGH (ref 0.44–1.00)
Creatinine, Ser: 1.38 mg/dL — ABNORMAL HIGH (ref 0.44–1.00)
Creatinine, Ser: 0.85 mg/dL (ref 0.44–1.00)
GFR calc Af Amer: >60 mL/min (ref >60)
GFR calc Af Amer: >60 mL/min (ref >60)
GFR calc Af Amer: >60 mL/min (ref >60)
GFR calc Af Amer: >60 mL/min (ref >60)
GFR calc Af Amer: >60 mL/min (ref >60)
GFR calc Af Amer: 60 mL/min — ABNORMAL LOW (ref >60)
GFR calc Af Amer: >60 mL/min (ref >60)
GFR calc non Af Amer: >60 mL/min (ref >60)
GFR calc non Af Amer: >60 mL/min (ref >60)
GFR calc non Af Amer: 56 mL/min — ABNORMAL LOW (ref >60)
GFR calc non Af Amer: 59 mL/min — ABNORMAL LOW (ref >60)
GFR calc non Af Amer: >60 mL/min (ref >60)
GFR calc non Af Amer: 55 mL/min — ABNORMAL LOW (ref >60)
GFR calc non Af Amer: 52 mL/min — ABNORMAL LOW (ref >60)
GFR calc non Af Amer: >60 mL/min (ref >60)
GLUCOSE: 90 mg/dL (ref 70–99)
Glucose, Bld: 104 mg/dL — ABNORMAL HIGH (ref 70–99)
Glucose, Bld: 81 mg/dL (ref 70–99)
Glucose, Bld: 86 mg/dL (ref 70–99)
Glucose, Bld: 93 mg/dL (ref 70–99)
Glucose, Bld: 99 mg/dL (ref 70–99)
Glucose, Bld: 100 mg/dL — ABNORMAL HIGH (ref 70–99)
Glucose, Bld: 98 mg/dL (ref 70–99)
Glucose, Bld: 86 mg/dL (ref 70–99)
POTASSIUM: 3.5 mmol/L (ref 3.5–5.1)
Potassium: 3.3 mmol/L — ABNORMAL LOW (ref 3.5–5.1)
Potassium: 3.6 mmol/L (ref 3.5–5.1)
Potassium: 3.4 mmol/L — ABNORMAL LOW (ref 3.5–5.1)
Potassium: 3.7 mmol/L (ref 3.5–5.1)
Potassium: 3.7 mmol/L (ref 3.5–5.1)
Potassium: 3.6 mmol/L (ref 3.5–5.1)
Potassium: 3.5 mmol/L (ref 3.5–5.1)
Potassium: 2.9 mmol/L — ABNORMAL LOW (ref 3.5–5.1)
Sodium: 132 mmol/L — ABNORMAL LOW (ref 135–145)
Sodium: 131 mmol/L — ABNORMAL LOW (ref 135–145)
Sodium: 129 mmol/L — ABNORMAL LOW (ref 135–145)
Sodium: 130 mmol/L — ABNORMAL LOW (ref 135–145)
Sodium: 132 mmol/L — ABNORMAL LOW (ref 135–145)
Sodium: 132 mmol/L — ABNORMAL LOW (ref 135–145)
Sodium: 130 mmol/L — ABNORMAL LOW (ref 135–145)
Sodium: 130 mmol/L — ABNORMAL LOW (ref 135–145)
Sodium: 131 mmol/L — ABNORMAL LOW (ref 135–145)
Total Bilirubin: 1.9 mg/dL — ABNORMAL HIGH (ref 0.3–1.2)
Total Bilirubin: 2.7 mg/dL — ABNORMAL HIGH (ref 0.3–1.2)
Total Bilirubin: 1.7 mg/dL — ABNORMAL HIGH (ref 0.3–1.2)
Total Bilirubin: 1.5 mg/dL — ABNORMAL HIGH (ref 0.3–1.2)
Total Bilirubin: 1.6 mg/dL — ABNORMAL HIGH (ref 0.3–1.2)
Total Bilirubin: 2.7 mg/dL — ABNORMAL HIGH (ref 0.3–1.2)
Total Bilirubin: 2.5 mg/dL — ABNORMAL HIGH (ref 0.3–1.2)
Total Bilirubin: 2 mg/dL — ABNORMAL HIGH (ref 0.3–1.2)
Total Bilirubin: 1 mg/dL (ref 0.3–1.2)
Total Protein: 6.3 g/dL — ABNORMAL LOW (ref 6.5–8.1)
Total Protein: 7.2 g/dL (ref 6.5–8.1)
Total Protein: 6 g/dL — ABNORMAL LOW (ref 6.5–8.1)
Total Protein: 5.8 g/dL — ABNORMAL LOW (ref 6.5–8.1)
Total Protein: 6.5 g/dL (ref 6.5–8.1)
Total Protein: 7.9 g/dL (ref 6.5–8.1)
Total Protein: 6.1 g/dL — ABNORMAL LOW (ref 6.5–8.1)
Total Protein: 5.9 g/dL — ABNORMAL LOW (ref 6.5–8.1)
Total Protein: 4.3 g/dL — ABNORMAL LOW (ref 6.5–8.1)

## 2018-11-28 LAB — APTT
aPTT: 57 seconds — ABNORMAL HIGH (ref 24–36)
aPTT: 31 seconds (ref 24–36)
aPTT: 31 seconds (ref 24–36)
aPTT: 32 seconds (ref 24–36)
aPTT: 28 seconds (ref 24–36)
aPTT: 32 seconds (ref 24–36)
aPTT: 29 seconds (ref 24–36)
aPTT: 38 seconds — ABNORMAL HIGH (ref 24–36)

## 2018-11-28 LAB — PREPARE RBC (CROSSMATCH)

## 2018-11-28 LAB — CBC
HCT: 28.8 % — ABNORMAL LOW (ref 36.0–46.0)
HCT: 23.4 % — ABNORMAL LOW (ref 36.0–46.0)
HCT: 26.6 % — ABNORMAL LOW (ref 36.0–46.0)
HCT: 31.7 % — ABNORMAL LOW (ref 36.0–46.0)
HCT: 34.4 % — ABNORMAL LOW (ref 36.0–46.0)
HCT: 25.8 % — ABNORMAL LOW (ref 36.0–46.0)
HCT: 24.9 % — ABNORMAL LOW (ref 36.0–46.0)
HCT: 17.5 % — ABNORMAL LOW (ref 36.0–46.0)
HEMATOCRIT: 30.9 % — AB (ref 36.0–46.0)
Hemoglobin: 9.5 g/dL — ABNORMAL LOW (ref 12.0–15.0)
Hemoglobin: 10.1 g/dL — ABNORMAL LOW (ref 12.0–15.0)
Hemoglobin: 7.9 g/dL — ABNORMAL LOW (ref 12.0–15.0)
Hemoglobin: 8.7 g/dL — ABNORMAL LOW (ref 12.0–15.0)
Hemoglobin: 10.5 g/dL — ABNORMAL LOW (ref 12.0–15.0)
Hemoglobin: 11.6 g/dL — ABNORMAL LOW (ref 12.0–15.0)
Hemoglobin: 8.8 g/dL — ABNORMAL LOW (ref 12.0–15.0)
Hemoglobin: 8.6 g/dL — ABNORMAL LOW (ref 12.0–15.0)
Hemoglobin: 5.8 g/dL — CL (ref 12.0–15.0)
MCH: 31.3 pg (ref 26.0–34.0)
MCH: 30.9 pg (ref 26.0–34.0)
MCH: 31.7 pg (ref 26.0–34.0)
MCH: 30.4 pg (ref 26.0–34.0)
MCH: 29.7 pg (ref 26.0–34.0)
MCH: 29.8 pg (ref 26.0–34.0)
MCH: 30.2 pg (ref 26.0–34.0)
MCH: 30.5 pg (ref 26.0–34.0)
MCH: 30.5 pg (ref 26.0–34.0)
MCHC: 32.7 g/dL (ref 30.0–36.0)
MCHC: 32.7 g/dL (ref 30.0–36.0)
MCHC: 33 g/dL (ref 30.0–36.0)
MCHC: 33.1 g/dL (ref 30.0–36.0)
MCHC: 33.1 g/dL (ref 30.0–36.0)
MCHC: 33.7 g/dL (ref 30.0–36.0)
MCHC: 33.8 g/dL (ref 30.0–36.0)
MCHC: 34.1 g/dL (ref 30.0–36.0)
MCHC: 34.5 g/dL (ref 30.0–36.0)
MCV: 94.7 fL (ref 80.0–100.0)
MCV: 94.5 fL (ref 80.0–100.0)
MCV: 94 fL (ref 80.0–100.0)
MCV: 93 fL (ref 80.0–100.0)
MCV: 89.8 fL (ref 80.0–100.0)
MCV: 88.4 fL (ref 80.0–100.0)
MCV: 88.7 fL (ref 80.0–100.0)
MCV: 88.3 fL (ref 80.0–100.0)
MCV: 92.1 fL (ref 80.0–100.0)
NRBC: 0 % (ref 0.0–0.2)
NRBC: 0 % (ref 0.0–0.2)
PLATELETS: 71 10*3/uL — AB (ref 150–400)
PLATELETS: 53 10*3/uL — AB (ref 150–400)
PLATELETS: 31 10*3/uL — AB (ref 150–400)
Platelets: 111 10*3/uL — ABNORMAL LOW (ref 150–400)
Platelets: 110 10*3/uL — ABNORMAL LOW (ref 150–400)
Platelets: 66 10*3/uL — ABNORMAL LOW (ref 150–400)
Platelets: 67 10*3/uL — ABNORMAL LOW (ref 150–400)
Platelets: 64 10*3/uL — ABNORMAL LOW (ref 150–400)
Platelets: 64 10*3/uL — ABNORMAL LOW (ref 150–400)
RBC: 3.04 MIL/uL — ABNORMAL LOW (ref 3.87–5.11)
RBC: 3.27 MIL/uL — ABNORMAL LOW (ref 3.87–5.11)
RBC: 2.49 MIL/uL — ABNORMAL LOW (ref 3.87–5.11)
RBC: 2.86 MIL/uL — ABNORMAL LOW (ref 3.87–5.11)
RBC: 3.53 MIL/uL — ABNORMAL LOW (ref 3.87–5.11)
RBC: 3.89 MIL/uL (ref 3.87–5.11)
RBC: 2.91 MIL/uL — ABNORMAL LOW (ref 3.87–5.11)
RBC: 2.82 MIL/uL — ABNORMAL LOW (ref 3.87–5.11)
RBC: 1.9 MIL/uL — ABNORMAL LOW (ref 3.87–5.11)
RDW: 13.1 % (ref 11.5–15.5)
RDW: 13.5 % (ref 11.5–15.5)
RDW: 13.6 % (ref 11.5–15.5)
RDW: 14 % (ref 11.5–15.5)
RDW: 15.6 % — AB (ref 11.5–15.5)
RDW: 15.8 % — AB (ref 11.5–15.5)
RDW: 16.3 % — AB (ref 11.5–15.5)
RDW: 16.7 % — AB (ref 11.5–15.5)
RDW: 17 % — ABNORMAL HIGH (ref 11.5–15.5)
WBC: 15.9 10*3/uL — ABNORMAL HIGH (ref 4.0–10.5)
WBC: 19.8 10*3/uL — ABNORMAL HIGH (ref 4.0–10.5)
WBC: 16.1 10*3/uL — AB (ref 4.0–10.5)
WBC: 15.7 10*3/uL — AB (ref 4.0–10.5)
WBC: 20.8 10*3/uL — ABNORMAL HIGH (ref 4.0–10.5)
WBC: 19.5 10*3/uL — ABNORMAL HIGH (ref 4.0–10.5)
WBC: 16.5 10*3/uL — ABNORMAL HIGH (ref 4.0–10.5)
WBC: 16.8 10*3/uL — ABNORMAL HIGH (ref 4.0–10.5)
WBC: 8.7 10*3/uL (ref 4.0–10.5)
nRBC: 0.2 % (ref 0.0–0.2)
nRBC: 0.1 % (ref 0.0–0.2)
nRBC: 0 % (ref 0.0–0.2)
nRBC: 0.2 % (ref 0.0–0.2)
nRBC: 0.3 % — ABNORMAL HIGH (ref 0.0–0.2)
nRBC: 0.5 % — ABNORMAL HIGH (ref 0.0–0.2)

## 2018-11-28 LAB — RAPID URINE DRUG SCREEN, HOSP PERFORMED
AMPHETAMINES: NOT DETECTED
Barbiturates: NOT DETECTED
Benzodiazepines: NOT DETECTED
Cocaine: NOT DETECTED
Opiates: NOT DETECTED
Tetrahydrocannabinol: POSITIVE — AB

## 2018-11-28 LAB — FIBRINOGEN
Fibrinogen: <60 mg/dL — CL (ref 210–475)
Fibrinogen: 155 mg/dL — ABNORMAL LOW (ref 210–475)
Fibrinogen: 161 mg/dL — ABNORMAL LOW (ref 210–475)
Fibrinogen: 169 mg/dL — ABNORMAL LOW (ref 210–475)
Fibrinogen: 220 mg/dL (ref 210–475)
Fibrinogen: 191 mg/dL — ABNORMAL LOW (ref 210–475)
Fibrinogen: 197 mg/dL — ABNORMAL LOW (ref 210–475)
Fibrinogen: 87 mg/dL — CL (ref 210–475)

## 2018-11-28 LAB — PROTIME-INR
INR: 2.4
INR: 1.38
INR: 1.28
INR: 1.12
INR: 1.13
PROTHROMBIN TIME: 25.8 s — AB (ref 11.4–15.2)
Prothrombin Time: 16.8 seconds — ABNORMAL HIGH (ref 11.4–15.2)
Prothrombin Time: 15.9 seconds — ABNORMAL HIGH (ref 11.4–15.2)
Prothrombin Time: 14.3 seconds (ref 11.4–15.2)
Prothrombin Time: 14.4 seconds (ref 11.4–15.2)

## 2018-11-28 LAB — ABO/RH: ABO/RH(D): B POS

## 2018-11-28 LAB — URINALYSIS, ROUTINE W REFLEX MICROSCOPIC
Bilirubin Urine: NEGATIVE
Glucose, UA: NEGATIVE mg/dL
Ketones, ur: NEGATIVE mg/dL
Nitrite: NEGATIVE
PH: 7 (ref 5.0–8.0)
Protein, ur: >=300 mg/dL — AB
Specific Gravity, Urine: 1.011 (ref 1.005–1.030)

## 2018-11-28 LAB — MAGNESIUM
Magnesium: 5.8 mg/dL — ABNORMAL HIGH (ref 1.7–2.4)
Magnesium: 6.8 mg/dL (ref 1.7–2.4)
Magnesium: 9.6 mg/dL (ref 1.7–2.4)
Magnesium: 6.8 mg/dL (ref 1.7–2.4)
Magnesium: 6.1 mg/dL (ref 1.7–2.4)
Magnesium: 5.8 mg/dL — ABNORMAL HIGH (ref 1.7–2.4)
Magnesium: 12 mg/dL (ref 1.7–2.4)

## 2018-11-28 LAB — LACTATE DEHYDROGENASE: LDH: 436 U/L — ABNORMAL HIGH (ref 98–192)

## 2018-11-28 LAB — PROTEIN / CREATININE RATIO, URINE
Creatinine, Urine: 100 mg/dL
Protein Creatinine Ratio: 12.3 mg/mg{Cre} — ABNORMAL HIGH (ref 0.00–0.15)
Total Protein, Urine: 1230 mg/dL

## 2018-11-28 LAB — SAVE SMEAR(SSMR), FOR PROVIDER SLIDE REVIEW

## 2018-11-28 LAB — SAVE SMEAR (SSMR)

## 2018-11-28 SURGERY — Surgical Case
Anesthesia: Regional

## 2018-11-28 MED ORDER — MISOPROSTOL 200 MCG PO TABS
1000.0000 ug | ORAL_TABLET | Freq: Once | ORAL | Status: AC
  Administered 2018-11-28: 1000 ug via RECTAL

## 2018-11-28 MED ORDER — CARBOPROST TROMETHAMINE 250 MCG/ML IM SOLN
250.0000 ug | Freq: Once | INTRAMUSCULAR | Status: AC
  Administered 2018-11-28: 250 ug via INTRAMUSCULAR

## 2018-11-28 MED ORDER — HYDRALAZINE HCL 20 MG/ML IJ SOLN
10.0000 mg | INTRAMUSCULAR | Status: DC | PRN
  Administered 2018-11-29: 10 mg via INTRAVENOUS
  Filled 2018-11-28: qty 1

## 2018-11-28 MED ORDER — OXYTOCIN 40 UNITS IN LACTATED RINGERS INFUSION - SIMPLE MED: 1.0000 m[IU]/min | INTRAVENOUS | Status: DC

## 2018-11-28 MED ORDER — CEFAZOLIN SODIUM-DEXTROSE 2-4 GM/100ML-% IV SOLN
2.0000 g | Freq: Once | INTRAVENOUS | Status: AC
  Administered 2018-11-28: 2 g via INTRAVENOUS

## 2018-11-28 MED ORDER — OXYTOCIN 40 UNITS IN LACTATED RINGERS INFUSION - SIMPLE MED
2.5000 [IU]/h | INTRAVENOUS | Status: DC
  Filled 2018-11-28: qty 1000

## 2018-11-28 MED ORDER — MAGNESIUM SULFATE 40 G IN LACTATED RINGERS - SIMPLE
1.0000 g/h | INTRAVENOUS | Status: DC
  Administered 2018-11-29: 1 g/h via INTRAVENOUS
  Filled 2018-11-28 (×2): qty 500

## 2018-11-28 MED ORDER — LABETALOL HCL 5 MG/ML IV SOLN
20.0000 mg | INTRAVENOUS | Status: DC | PRN
  Administered 2018-11-28 – 2018-11-29 (×2): 20 mg via INTRAVENOUS
  Filled 2018-11-28 (×2): qty 4

## 2018-11-28 MED ORDER — FENTANYL 2.5 MCG/ML BUPIVACAINE 1/10 % EPIDURAL INFUSION (WH - ANES)
14.0000 mL/h | INTRAMUSCULAR | Status: DC | PRN
  Administered 2018-11-28 (×3): 14 mL/h via EPIDURAL
  Filled 2018-11-28 (×3): qty 100

## 2018-11-28 MED ORDER — EPHEDRINE 5 MG/ML INJ
10.0000 mg | INTRAVENOUS | Status: DC | PRN
  Administered 2018-11-28: 10 mg via INTRAVENOUS
  Filled 2018-11-28: qty 2

## 2018-11-28 MED ORDER — LACTATED RINGERS IV SOLN: 500.0000 mL | Freq: Once | INTRAVENOUS | Status: DC

## 2018-11-28 MED ORDER — CALCIUM GLUCONATE 10 % IV SOLN: 2.0000 g | Freq: Once | INTRAVENOUS | Status: DC

## 2018-11-28 MED ORDER — LIDOCAINE HCL (PF) 1 % IJ SOLN
30.0000 mL | INTRAMUSCULAR | Status: DC | PRN
  Filled 2018-11-28: qty 30

## 2018-11-28 MED ORDER — LIDOCAINE-EPINEPHRINE (PF) 2 %-1:200000 IJ SOLN
INTRAMUSCULAR | Status: DC | PRN
  Administered 2018-11-28: 3 mL via EPIDURAL
  Administered 2018-11-28: 4 mL via EPIDURAL

## 2018-11-28 MED ORDER — ACETAMINOPHEN 500 MG PO TABS
1000.0000 mg | ORAL_TABLET | Freq: Once | ORAL | Status: AC
  Administered 2018-11-28: 1000 mg via ORAL
  Filled 2018-11-28 (×2): qty 2

## 2018-11-28 MED ORDER — OXYTOCIN 40 UNITS IN LACTATED RINGERS INFUSION - SIMPLE MED
1.0000 m[IU]/min | INTRAVENOUS | Status: DC
  Administered 2018-11-28: 1 m[IU]/min via INTRAVENOUS

## 2018-11-28 MED ORDER — SODIUM CHLORIDE 0.9 % IV SOLN: Freq: Once | INTRAVENOUS | Status: DC

## 2018-11-28 MED ORDER — SODIUM CHLORIDE 0.9% IV SOLUTION: Freq: Once | INTRAVENOUS | Status: DC

## 2018-11-28 MED ORDER — OXYTOCIN 10 UNIT/ML IJ SOLN
INTRAMUSCULAR | Status: AC
  Administered 2018-11-28: 20 [IU] via INTRAMUSCULAR
  Filled 2018-11-28: qty 2

## 2018-11-28 MED ORDER — EPHEDRINE 5 MG/ML INJ
10.0000 mg | INTRAVENOUS | Status: DC | PRN
  Filled 2018-11-28: qty 4
  Filled 2018-11-28: qty 2

## 2018-11-28 MED ORDER — SODIUM CHLORIDE 0.9% IV SOLUTION
Freq: Once | INTRAVENOUS | Status: AC
  Administered 2018-11-28: 22:00:00 via INTRAVENOUS

## 2018-11-28 MED ORDER — SODIUM CHLORIDE 0.9% IV SOLUTION
Freq: Once | INTRAVENOUS | Status: AC
  Administered 2018-11-28: 05:00:00 via INTRAVENOUS

## 2018-11-28 MED ORDER — MAGNESIUM SULFATE BOLUS VIA INFUSION
4.0000 g | Freq: Once | INTRAVENOUS | Status: AC
  Administered 2018-11-28: 4 g via INTRAVENOUS
  Filled 2018-11-28: qty 500

## 2018-11-28 MED ORDER — LABETALOL HCL 5 MG/ML IV SOLN
40.0000 mg | INTRAVENOUS | Status: DC | PRN
  Administered 2018-11-29: 40 mg via INTRAVENOUS
  Filled 2018-11-28: qty 8

## 2018-11-28 MED ORDER — MISOPROSTOL 200 MCG PO TABS
ORAL_TABLET | ORAL | Status: AC
  Administered 2018-11-28: 1000 ug via RECTAL
  Filled 2018-11-28: qty 5

## 2018-11-28 MED ORDER — OXYTOCIN BOLUS FROM INFUSION
500.0000 mL | Freq: Once | INTRAVENOUS | Status: AC
  Administered 2018-11-28: 500 mL via INTRAVENOUS

## 2018-11-28 MED ORDER — DIPHENHYDRAMINE HCL 50 MG/ML IJ SOLN: 25.0000 mg | Freq: Once | INTRAMUSCULAR | Status: DC

## 2018-11-28 MED ORDER — PHENYLEPHRINE 40 MCG/ML (10ML) SYRINGE FOR IV PUSH (FOR BLOOD PRESSURE SUPPORT)
80.0000 ug | PREFILLED_SYRINGE | INTRAVENOUS | Status: DC | PRN
  Administered 2018-11-28: 80 ug via INTRAVENOUS
  Filled 2018-11-28 (×3): qty 10

## 2018-11-28 MED ORDER — CALCIUM GLUCONATE-NACL 1-0.675 GM/50ML-% IV SOLN
1.0000 g | Freq: Once | INTRAVENOUS | Status: AC
  Administered 2018-11-28: 1000 mg via INTRAVENOUS

## 2018-11-28 MED ORDER — ONDANSETRON HCL 4 MG/2ML IJ SOLN: 4.0000 mg | Freq: Four times a day (QID) | INTRAMUSCULAR | Status: DC | PRN

## 2018-11-28 MED ORDER — LACTATED RINGERS IV SOLN: 500.0000 mL | INTRAVENOUS | Status: DC | PRN

## 2018-11-28 MED ORDER — DIPHENOXYLATE-ATROPINE 2.5-0.025 MG PO TABS
2.0000 | ORAL_TABLET | Freq: Once | ORAL | Status: AC
  Administered 2018-11-28: 2 via ORAL
  Filled 2018-11-28: qty 2

## 2018-11-28 MED ORDER — ALBUMIN HUMAN 5 % IV SOLN
INTRAVENOUS | Status: AC
  Filled 2018-11-28: qty 250

## 2018-11-28 MED ORDER — ALBUMIN HUMAN 5 % IV SOLN
12.5000 g | Freq: Once | INTRAVENOUS | Status: AC
  Administered 2018-11-28: 12.5 g via INTRAVENOUS

## 2018-11-28 MED ORDER — FENTANYL CITRATE (PF) 100 MCG/2ML IJ SOLN
100.0000 ug | INTRAMUSCULAR | Status: DC | PRN
  Filled 2018-11-28: qty 2

## 2018-11-28 MED ORDER — CALCIUM GLUCONATE 10 % IV SOLN: 1.0000 g | Freq: Once | INTRAVENOUS | Status: DC

## 2018-11-28 MED ORDER — ACETAMINOPHEN 10 MG/ML IV SOLN
1000.0000 mg | Freq: Four times a day (QID) | INTRAVENOUS | Status: DC
  Administered 2018-11-28 – 2018-11-29 (×3): 1000 mg via INTRAVENOUS
  Filled 2018-11-28 (×6): qty 100

## 2018-11-28 MED ORDER — PHENYLEPHRINE 40 MCG/ML (10ML) SYRINGE FOR IV PUSH (FOR BLOOD PRESSURE SUPPORT)
80.0000 ug | PREFILLED_SYRINGE | INTRAVENOUS | Status: AC | PRN
  Administered 2018-11-28 (×3): 80 ug via INTRAVENOUS

## 2018-11-28 MED ORDER — DIPHENHYDRAMINE HCL 50 MG/ML IJ SOLN
12.5000 mg | INTRAMUSCULAR | Status: DC | PRN
  Filled 2018-11-28: qty 1

## 2018-11-28 MED ORDER — SOD CITRATE-CITRIC ACID 500-334 MG/5ML PO SOLN: 30.0000 mL | ORAL | Status: DC | PRN

## 2018-11-28 MED ORDER — FLEET ENEMA 7-19 GM/118ML RE ENEM: 1.0000 | ENEMA | RECTAL | Status: DC | PRN

## 2018-11-28 MED ORDER — SODIUM CHLORIDE 0.9 % IV SOLN
INTRAVENOUS | Status: DC
  Administered 2018-11-28 – 2018-11-29 (×3): via INTRAVENOUS

## 2018-11-28 MED ORDER — LABETALOL HCL 5 MG/ML IV SOLN
80.0000 mg | INTRAVENOUS | Status: DC | PRN
  Administered 2018-11-29: 80 mg via INTRAVENOUS
  Filled 2018-11-28: qty 16

## 2018-11-28 MED ORDER — OXYTOCIN 10 UNIT/ML IJ SOLN
20.0000 [IU] | Freq: Once | INTRAMUSCULAR | Status: AC
  Administered 2018-11-28: 20 [IU] via INTRAMUSCULAR

## 2018-11-28 MED ORDER — CALCIUM GLUCONATE-NACL 1-0.675 GM/50ML-% IV SOLN
INTRAVENOUS | Status: AC
  Administered 2018-11-28: 1000 mg via INTRAVENOUS
  Filled 2018-11-28: qty 50

## 2018-11-28 MED ORDER — LACTATED RINGERS IV SOLN
INTRAVENOUS | Status: DC
  Administered 2018-11-28 (×2): via INTRAVENOUS

## 2018-11-28 MED ORDER — SODIUM CHLORIDE 0.9 % IV BOLUS
250.0000 mL | Freq: Once | INTRAVENOUS | Status: AC
  Administered 2018-11-28: 250 mL via INTRAVENOUS

## 2018-11-28 MED ORDER — CARBOPROST TROMETHAMINE 250 MCG/ML IM SOLN
INTRAMUSCULAR | Status: AC
  Administered 2018-11-28: 250 ug via INTRAMUSCULAR
  Filled 2018-11-28: qty 1

## 2018-11-28 NOTE — Progress Notes (Signed)
Faculty Note  S: Pt is comfortable, had some nausea/vomiting.   O: BP (!) 152/97   Pulse (!) 114   Temp 98.2 F (36.8 C) (Oral)   Resp 18   Ht 5\' 4"  (1.626 m)   Wt 70.3 kg   LMP 02/10/2018 (Approximate)   SpO2 100%   BMI 26.61 kg/m   FHT: n/a Toco: ctx every 1 min SVE: 5-6/80/-1, bag intact  CBC Latest Ref Rng & Units 11/28/2018 11/28/2018 11/28/2018  WBC 4.0 - 10.5 K/uL 20.8(H) 15.7(H) 16.1(H)  Hemoglobin 12.0 - 15.0 g/dL 10.5(L) 8.7(L) 7.9(L)  Hematocrit 36.0 - 46.0 % 31.7(L) 26.6(L) 23.4(L)  Platelets 150 - 400 K/uL 67(L) 71(L) 66(L)   CMP Latest Ref Rng & Units 11/28/2018 11/28/2018 11/28/2018  Glucose 70 - 99 mg/dL 99 93 86  BUN 6 - 20 mg/dL 9 10 10   Creatinine 0.44 - 1.00 mg/dL 2.13(Y1.23(H) 8.65(H1.28(H) 8.46(N1.12(H)  Sodium 135 - 145 mmol/L 132(L) 130(L) 129(L)  Potassium 3.5 - 5.1 mmol/L 3.7 3.5 3.4(L)  Chloride 98 - 111 mmol/L 103 103 100  CO2 22 - 32 mmol/L 20(L) 20(L) 21(L)  Calcium 8.9 - 10.3 mg/dL 8.0(L) 8.0(L) 8.1(L)  Total Protein 6.5 - 8.1 g/dL 6.5 6.2(X5.8(L) 6.0(L)  Total Bilirubin 0.3 - 1.2 mg/dL 5.2(W1.6(H) 4.1(L1.5(H) 2.4(M1.7(H)  Alkaline Phos 38 - 126 U/L 479(H) 396(H) 323(H)  AST 15 - 41 U/L 43(H) 38 36  ALT 0 - 44 U/L 12 10 10    Fibrinogen: <60 --> 155 --> 161 --> 169  A/P: Pt is 28 y.o. G3P1011 @ 832w0d for TOLAC who isadmitted for induction of labor forIUFD in the setting of preeclampsia with severe features and DIC. Last sent of labs stable, s/p 2 pools cryo, 2 units pRBCs, 1 pack PLT, 1 unit plasma. Last labs stable. MgSO4 turned off for Mg level 9.6. She is making progress, will AROM with next exam  MgSO4 off x ~ 4 hrs, will restart when able Labs Q2 Cont pitocin Cont repletion of coag factors prn    K. Therese SarahMeryl Zavala, M.D. Attending Center for [facility](Faculty Practice)

## 2018-11-28 NOTE — Progress Notes (Signed)
Pt had just delivered when I first arrived and she was surrounded by doctors and nurses. Her SO was also bedside. I had the opportunity to introduce myself to him. He was the first to hold their baby and told me her name is Christina Zavala. He was grieving, but talked about how beautiful his baby is and that she looks just like her mother. As staff was working w/the pt I offered continual support to the family. Pt's dad and his SO arrived later during my visit. They were appropriately tearful and attentive. In addition to listening and a support of presence, I offered nutrition and hydration. The family asked for prayer for which they were grateful. Please page if any changes occur and whenever additional support is needed. Chaplain Marjory Lies, [place]   11/28/18 2100  Clinical Encounter Type  Visited With Family;Patient and family together

## 2018-11-28 NOTE — Progress Notes (Addendum)
Additional 60ml added to bakri balloon, 2 laps placed with bakri balloon in vagina

## 2018-11-28 NOTE — Progress Notes (Signed)
I've spent time throughout the day visiting with Deniece PortelaJacquese and her partner as well as her father and his partner.  Deniece PortelaJacquese shares that she is just trying to keep it together.  She is distressed over her vision difficulty and concerned that the problem will not resolve itself.  I normalized her fears and offered listening presence.  She stated, "I'm trying to calm down like he told me to.  I've got to keep it together so I can get out of here for my child."  She has a 68seven year old currently under the care of her mother.  Patient was particularly anxious this morning and I brought her a cd player as well as a "Meditations for Healing" CD to offer some distraction and relaxing prompts.  This afternoon she has been dozing on and off and is comforted by the eye cover provided by her nurse, Harmony.    FOB stated that Gerarda Guntherllie would have been his third child and he is eager to meet his daughter.    Pt's father was tearful and I assisted him in finding some tissues and offering space to talk about his feelings.  I validated his grief over the loss of his grandchild and worry about his daughter.    Please page as further needs arise.  Maryanna ShapeAmanda M. Carley Hammedavee Lomax, M.Div. [facility] Chaplain Pager [phone] Office [phone]   11/28/18 1447  Clinical Encounter Type  Visited With Patient and family together  Visit Type Spiritual support  Spiritual Encounters  Spiritual Needs Emotional;Grief support  Stress Factors  Patient Stress Factors Health changes

## 2018-11-28 NOTE — Progress Notes (Addendum)
bakri balloon placed by dr Earlene Plater. fluid inserted in bakri

## 2018-11-28 NOTE — Progress Notes (Signed)
Mag level of 9.6 reported to Dr Earlene Plateravis. Magnesium stopped.

## 2018-11-28 NOTE — Progress Notes (Addendum)
Alerted by RN that patient has Magnesium level of 12.0. She has been on 1 g/hr, MgSO4 stopped. She is hypotensive and required a dose of phenylephrine, 1 g calcium gluconate ordered. 1 unit cryo and 1 unit FFP ordered at this time as well. BP improved with phenylephrine, will keep on L&D tonight and monitor closely. Repeat labs set for 10 pm.    Baldemar Lenis, M.D. Attending [facility])

## 2018-11-28 NOTE — Plan of Care (Signed)
Pt desires to see chaplain at some point during day while family is present.  RN in frequent communication with MD regarding labs, urine output, pain, and other pre-E symptoms.     Problem: Spiritual Needs Goal: Ability to function at adequate level Outcome: Progressing   Problem: Education: Goal: Knowledge of Childbirth will improve Outcome: Progressing Goal: Ability to make informed decisions regarding treatment and plan of care will improve Outcome: Progressing Goal: Ability to state and carry out methods to decrease the pain will improve Outcome: Progressing   Problem: Coping: Goal: Ability to verbalize concerns and feelings about labor and delivery will improve Outcome: Progressing   Problem: Life Cycle: Goal: Ability to make normal progression through stages of labor will improve Outcome: Progressing Goal: Ability to effectively push during vaginal delivery will improve Outcome: Progressing   Problem: Role Relationship: Goal: Ability to demonstrate positive interaction with the child will improve Outcome: Progressing   Problem: Safety: Goal: Risk of complications during labor and delivery will decrease Outcome: Progressing   Problem: Pain Management: Goal: Relief or control of pain from uterine contractions will improve Outcome: Progressing   Problem: Education: Goal: Knowledge of disease or condition will improve Outcome: Progressing Goal: Knowledge of the prescribed therapeutic regimen will improve Outcome: Progressing   Problem: Fluid Volume: Goal: Peripheral tissue perfusion will improve Outcome: Progressing   Problem: Clinical Measurements: Goal: Complications related to disease process, condition or treatment will be avoided or minimized Outcome: Progressing

## 2018-11-28 NOTE — Progress Notes (Signed)
Faculty Note  S: Pt is comfortable with epidural.   O: BP (!) 164/100   Pulse 80   Temp 97.7 F (36.5 C) (Oral)   Resp 18   Ht 5\' 4"  (1.626 m)   Wt 70.3 kg   LMP 02/10/2018 (Approximate)   SpO2 100%   BMI 26.61 kg/m   FHT: n/a Toco: ctx every min SVE: 6/80/-1, AROM performed clear fluid with clots  A/P: Pt is 28 y.o. G3P1011 @ [redacted]w[redacted]d for TOLAC who isadmitted for induction of labor forIUFD in the setting of preeclampsia with severe features and DIC.Last sent of labs stable, s/p 2 pools cryo, 2 units pRBCs, 1 pack PLT, 1 unit plasma. Last labs stable. MgSO4 turned off for Mg level 9.6, most recent level 6.8. S/p AROM with this exam, clots noted with clear fluid.    MgSO4 off x ~ 4 hrs, will restart when able Labs Q2 Cont pitocin Cont repletion of coag factors prn   K. Therese Sarah, M.D. Attending [facility])

## 2018-11-28 NOTE — H&P (Addendum)
+OBSTETRIC ADMISSION HISTORY AND PHYSICAL  Christina Zavala is a 28 y.o. female G3P1011 with IUP at 7427w0d by --/29 presenting for abdominal pain and found to have intrauterine fetal demise. States wasn't feeling well today - had headache and blurry vision so laid down for a nap around noon. She woke up around 4pm with abdominal pain. Had trouble finding a ride to the hospital until tonight. States last felt fetal movement before nap.    Denies vaginal bleeding, leakage of fluids. Denies vomiting but reports some nausea. Currently no headache but states she just doesn't feel right. Still has spots in vision.   She [facility].  Support person in labor: boyfriend and FOB Christina Zavala    Ultrasounds . 29w0: normal anatomy U/S, EFW 57%, anterior placenta   Prenatal History/Complications: . History of Cesarean section - arrest of descent   Past Medical History: Past Medical History:  Diagnosis Date  . Achilles rupture, left     Past Surgical History: Past Surgical History:  Procedure Laterality Date  . CESAREAN SECTION      Obstetrical History: OB History    Gravida  3   Para  1   Term  1   Preterm      AB  1   Living  1     SAB  1   TAB      Ectopic      Multiple      Live Births  1           Social History: Social History   Socioeconomic History  . Marital status: Single    Spouse name: Not on file  . Number of children: Not on file  . Years of education: Not on file  . Highest education level: Not on file  Occupational History  . Not on file  Social Needs  . Financial resource strain: Not on file  . Food insecurity:    Worry: Not on file    Inability: Not on file  . Transportation needs:    Medical: Not on file    Non-medical: Not on file  Tobacco Use  . Smoking status: Former Smoker    Packs/day: 0.00  . Smokeless tobacco: Never Used  Substance and Sexual Activity  . Alcohol use: Never    Frequency: Never  . Drug  use: No  . Sexual activity: Yes    Birth control/protection: None  Lifestyle  . Physical activity:    Days per week: Not on file    Minutes per session: Not on file  . Stress: Not on file  Relationships  . Social connections:    Talks on phone: Not on file    Gets together: Not on file    Attends religious service: Not on file    Active member of club or organization: Not on file    Attends meetings of clubs or organizations: Not on file    Relationship status: Not on file  Other Topics Concern  . Not on file  Social History Narrative  . Not on file    Family History: No family history on file.  Allergies: No Known Allergies  Medications Prior to Admission  Medication Sig Dispense Refill Last Dose  . metroNIDAZOLE (FLAGYL) 500 MG tablet Take 1 tablet (500 mg total) by mouth 2 (two) times daily. 14 tablet 0 Taking  . Prenatal Vit-Fe Fumarate-FA (PRENATAL 19) tablet Chew 1 tablet by mouth daily. 90 tablet 3 Taking  Review of Systems  All systems reviewed and negative except as stated in HPI  Blood pressure (!) 147/90, pulse 77, temperature (!) 97.4 F (36.3 C), temperature source Oral, resp. rate 18, last menstrual period 02/10/2018. General appearance: uncomfortable appearing, lying on side, shivering  Lungs: no respiratory distress Heart: regular rate  Abdomen: gravid, diffusely TTP but L>R, consistently firm to palpation Pelvic: deferred Extremities: no lower extremity edema   Prenatal labs: ABO, Rh: B/Positive/-- (05/24 1200) Antibody: Negative (05/24 1200) Rubella: 1.12 (05/24 1200) RPR: Non Reactive (11/07 0918)  HBsAg: Negative (05/24 1200)  HIV: Non Reactive (11/07 0918)  GBS:   negative Glucola: 70, 141, 99  normal 2-hr Genetic screening:  Not done  Results for orders placed or performed during the hospital encounter of 11/27/18 (from the past 24 hour(s))  Urinalysis, Routine w reflex microscopic   Collection Time: 11/28/18 12:21 AM  Result  Value Ref Range   Color, Urine YELLOW YELLOW   APPearance HAZY (A) CLEAR   Specific Gravity, Urine 1.011 1.005 - 1.030   pH 7.0 5.0 - 8.0   Glucose, UA NEGATIVE NEGATIVE mg/dL   Hgb urine dipstick SMALL (A) NEGATIVE   Bilirubin Urine NEGATIVE NEGATIVE   Ketones, ur NEGATIVE NEGATIVE mg/dL   Protein, ur >=295 (A) NEGATIVE mg/dL   Nitrite NEGATIVE NEGATIVE   Leukocytes, UA TRACE (A) NEGATIVE   RBC / HPF 0-5 0 - 5 RBC/hpf   WBC, UA 6-10 0 - 5 WBC/hpf   Bacteria, UA RARE (A) NONE SEEN   Squamous Epithelial / LPF 0-5 0 - 5   Mucus PRESENT   Urine rapid drug screen (hosp performed)   Collection Time: 11/28/18 12:21 AM  Result Value Ref Range   Opiates NONE DETECTED NONE DETECTED   Cocaine NONE DETECTED NONE DETECTED   Benzodiazepines NONE DETECTED NONE DETECTED   Amphetamines NONE DETECTED NONE DETECTED   Tetrahydrocannabinol POSITIVE (A) NONE DETECTED   Barbiturates NONE DETECTED NONE DETECTED  CBC   Collection Time: 11/28/18 12:34 AM  Result Value Ref Range   WBC 15.9 (H) 4.0 - 10.5 K/uL   RBC 3.04 (L) 3.87 - 5.11 MIL/uL   Hemoglobin 9.5 (L) 12.0 - 15.0 g/dL   HCT 62.1 (L) 30.8 - 65.7 %   MCV 94.7 80.0 - 100.0 fL   MCH 31.3 26.0 - 34.0 pg   MCHC 33.0 30.0 - 36.0 g/dL   RDW 84.6 96.2 - 95.2 %   Platelets 111 (L) 150 - 400 K/uL  Comprehensive metabolic panel   Collection Time: 11/28/18 12:34 AM  Result Value Ref Range   Sodium 132 (L) 135 - 145 mmol/L   Potassium 3.3 (L) 3.5 - 5.1 mmol/L   Chloride 103 98 - 111 mmol/L   CO2 21 (L) 22 - 32 mmol/L   Glucose, Bld 104 (H) 70 - 99 mg/dL   BUN 8 6 - 20 mg/dL   Creatinine, Ser 8.41 (H) 0.44 - 1.00 mg/dL   Calcium 8.1 (L) 8.9 - 10.3 mg/dL   Total Protein 6.3 (L) 6.5 - 8.1 g/dL   Albumin 3.1 (L) 3.5 - 5.0 g/dL   AST 32 15 - 41 U/L   ALT 10 0 - 44 U/L   Alkaline Phosphatase 203 (H) 38 - 126 U/L   Total Bilirubin 1.9 (H) 0.3 - 1.2 mg/dL   GFR calc non Af Amer >60 >60 mL/min   GFR calc Af Amer >60 >60 mL/min   Anion gap 8 5 -  15  Patient Active Problem List   Diagnosis Date Noted  . IUFD at 20 weeks or more of gestation 11/28/2018  . Chlamydia infection affecting pregnancy 04/20/2018  . Supervision of other normal pregnancy, antepartum 04/15/2018  . Previous cesarean delivery affecting pregnancy, antepartum 04/15/2018  . Achilles tendon tear, left, initial encounter 03/15/2018  . Iron deficiency anemia due to chronic blood loss 04/24/2017  . SAB (spontaneous abortion) 04/24/2017    Assessment/Plan:  Julyssa Blackler is a 28 y.o. G3P1011 at [redacted]w[redacted]d who presented with abdominal pain and was found to have intrauterine fetal demise in the setting of new diagnosis of severe preeclampsia.   Preeclampsia with Severe Features by Blood Pressures, Acute Kidney Failure Cr 1.2, Visual Scotoma and Thrombocytopenia (111): New onset. Initially presented to [facility] with visual changes, headache, mildly elevated BP. BP continued to escalate on admission, labs consistent with severe preeclampsia as noted. UPC over 12.  -- Mg++  -- continue to monitor/treat BP closely -- will check labs every 4 hours   Induction  IUFD  Concern for DIC: IUFD likely related to combination of factors and unclear underlying etiology but evidence of placental abruption on U/S (placental lakes, confirmation of IUFD). Also some bleeding with FB placement. No bleeding from IV sites at this time. Concerning for DIC given low fibrinogen, prolonged PT/PTT. Also evidence of hemolysis with increasing bilirubin, abnormal LDH/haptoglobin. Platelets low in setting of severe preeclampsia. Maternal vitals stable at this time, will closely monitor, no current suspicion for uterine rupture.  -- FB placed -- staged induction - will consider low-dose pitocin once labs stable  -- pain control: epidural in place -- TOLAC consent verified verbally   Laurel S. Earlene Plater, DO OB/GYN Fellow  [facility] Attending Pt seen and examined with Dr Earlene Plater. FDIU confirmed most likley in  setting of SPEC with possible placental abruption. Presently no active vaginal bleeding. H/O LTCS. Mode of delivery reviewed with pt. R/B of RLTCS vs TOLAC discussed.  Pt amendable for TOLAC. Will check labs and initiate Magnesium therapy. Follow coags as needed. Will place FB for IOL. POC reviewed with pt. Pt voiced understanding.   Nettie Elm, MD

## 2018-11-28 NOTE — Anesthesia Procedure Notes (Signed)
Epidural Patient location during procedure: OB Start time: 11/28/2018 2:10 AM End time: 11/28/2018 2:25 AM  Staffing Anesthesiologist: Elmer Picker, MD Performed: anesthesiologist   Preanesthetic Checklist Completed: patient identified, pre-op evaluation, timeout performed, IV checked, risks and benefits discussed and monitors and equipment checked  Epidural Patient position: sitting Prep: site prepped and draped and DuraPrep Patient monitoring: continuous pulse ox, blood pressure, heart rate and cardiac monitor Approach: midline Location: L3-L4 Injection technique: LOR air  Needle:  Needle type: Tuohy  Needle gauge: 17 G Needle length: 9 cm Needle insertion depth: 6 cm Catheter type: closed end flexible Catheter size: 19 Gauge Catheter at skin depth: 12 cm Test dose: negative  Assessment Sensory level: T8 Events: blood not aspirated, injection not painful, no injection resistance, negative IV test and no paresthesia  Additional Notes Patient identified. Risks/Benefits/Options discussed with patient including but not limited to bleeding, infection, nerve damage, paralysis, failed block, incomplete pain control, headache, blood pressure changes, nausea, vomiting, reactions to medication both or allergic, itching and postpartum back pain. Confirmed with bedside nurse the patient's most recent platelet count. Confirmed with patient that they are not currently taking any anticoagulation, have any bleeding history or any family history of bleeding disorders. Patient expressed understanding and wished to proceed. All questions were answered. Sterile technique was used throughout the entire procedure. Please see nursing notes for vital signs. Test dose was given through epidural catheter and negative prior to continuing to dose epidural or start infusion. Warning signs of high block given to the patient including shortness of breath, tingling/numbness in hands, complete motor block, or  any concerning symptoms with instructions to call for help. Patient was given instructions on fall risk and not to get out of bed. All questions and concerns addressed with instructions to call with any issues or inadequate analgesia.  Reason for block:procedure for pain

## 2018-11-28 NOTE — Progress Notes (Addendum)
Faculty Note  S: Pt is comfortable with contractions, has epidural in place. Now complaining of blurred vision.   O: BP 139/89   Pulse 70   Temp 98.8 F (37.1 C) (Oral)   Resp 18   Ht 5\' 4"  (1.626 m)   Wt 70.3 kg   LMP 02/10/2018 (Approximate)   SpO2 99%   BMI 26.61 kg/m   FHT: n/a Toco: ctx every 1 min SVE: 2-3 with foley bulb in place but coming out, thin, some dark red clots and bleeding from vagina  CBC Latest Ref Rng & Units 11/28/2018 11/28/2018 11/28/2018  WBC 4.0 - 10.5 K/uL 16.1(H) 19.8(H) 15.9(H)  Hemoglobin 12.0 - 15.0 g/dL 7.9(L) 10.1(L) 9.5(L)  Hematocrit 36.0 - 46.0 % 23.4(L) 30.9(L) 28.8(L)  Platelets 150 - 400 K/uL 66(L) 110(L) 111(L)   CMP Latest Ref Rng & Units 11/28/2018 11/28/2018 11/28/2018  Glucose 70 - 99 mg/dL 86 81 544(B)  BUN 6 - 20 mg/dL 10 9 8   Creatinine 0.44 - 1.00 mg/dL 2.01(E) 0.71(Q) 1.97(J)  Sodium 135 - 145 mmol/L 129(L) 131(L) 132(L)  Potassium 3.5 - 5.1 mmol/L 3.4(L) 3.6 3.3(L)  Chloride 98 - 111 mmol/L 100 100 103  CO2 22 - 32 mmol/L 21(L) 21(L) 21(L)  Calcium 8.9 - 10.3 mg/dL 8.1(L) 8.8(L) 8.1(L)  Total Protein 6.5 - 8.1 g/dL 6.0(L) 7.2 6.3(L)  Total Bilirubin 0.3 - 1.2 mg/dL 8.8(T) 2.7(H) 1.9(H)  Alkaline Phos 38 - 126 U/L 323(H) 325(H) 203(H)  AST 15 - 41 U/L 36 46(H) 32  ALT 0 - 44 U/L 10 10 10     A/P: Pt is 28 y.o. G3P1011 @ [redacted]w[redacted]d who is for TOLAC who is admitted for induction of labor for IUFD in the setting of preeclampsia with severe features and DIC. Labs this am with slightly downtrending H/H, will give 2 units pRBCs and 1 pack platelets. Has previously received 2 pools cryo.   Reviewed course with patient, significant clotting disorder and pre-eclampsia and that we will make every attempt for vaginal delivery if possible, given the significant risk of performing repeat c-section in the setting of DIC. She verbalizes understanding and is agreeable to blood products.    Cont MgSO4 2 g/hr Labs Q 2 2 units pRBCs, 1 unit platelets  now S/p 2 units cryo Cont pitocin Will switch to NS for hyponatremia    K. Therese Sarah, M.D. Attending [facility] for [facility])

## 2018-11-28 NOTE — Progress Notes (Signed)
Mag 6.8 reported to dr Earlene Plater.

## 2018-11-28 NOTE — Progress Notes (Signed)
Magnesium level of 6.8 reported to dr Earlene Plater via phone

## 2018-11-28 NOTE — Anesthesia Preprocedure Evaluation (Signed)
Anesthesia Evaluation  Patient identified by MRN, date of birth, ID band Patient awake    Reviewed: Allergy & Precautions, NPO status , Patient's Chart, lab work & pertinent test results  Airway Mallampati: II  TM Distance: >3 FB Neck ROM: Full    Dental no notable dental hx.    Pulmonary neg pulmonary ROS, former smoker,    Pulmonary exam normal breath sounds clear to auscultation       Cardiovascular negative cardio ROS Normal cardiovascular exam Rhythm:Regular Rate:Normal     Neuro/Psych negative neurological ROS  negative psych ROS   GI/Hepatic negative GI ROS, Neg liver ROS,   Endo/Other  negative endocrine ROS  Renal/GU negative Renal ROS  negative genitourinary   Musculoskeletal negative musculoskeletal ROS (+)   Abdominal   Peds negative pediatric ROS (+)  Hematology  (+) Blood dyscrasia, anemia ,   Anesthesia Other Findings IUFD c/b possible abruption, HELLP  Reproductive/Obstetrics (+) Pregnancy                            Anesthesia Physical Anesthesia Plan  ASA: III  Anesthesia Plan: Epidural   Post-op Pain Management:    Induction:   PONV Risk Score and Plan: Treatment may vary due to age or medical condition  Airway Management Planned: Natural Airway  Additional Equipment:   Intra-op Plan:   Post-operative Plan:   Informed Consent: I have reviewed the patients History and Physical, chart, labs and discussed the procedure including the risks, benefits and alternatives for the proposed anesthesia with the patient or authorized representative who has indicated his/her understanding and acceptance.     Plan Discussed with: Anesthesiologist  Anesthesia Plan Comments: (Patient identified. Risks, benefits, options discussed with patient including but not limited to bleeding, infection, nerve damage, paralysis, failed block, incomplete pain control, headache, blood  pressure changes, nausea, vomiting, reactions to medication, itching, and post partum back pain. Confirmed with bedside nurse the patient's most recent platelet count. Confirmed with the patient that they are not taking any anticoagulation, have any bleeding history or any family history of bleeding disorders. Patient expressed understanding and wishes to proceed. All questions were answered. )        Anesthesia Quick Evaluation

## 2018-11-28 NOTE — Progress Notes (Signed)
Faculty Note  S: Pt is sleeping.   O: BP (!) 154/96   Pulse 79   Temp 98.6 F (37 C) (Oral)   Resp 18   Ht 5\' 4"  (1.626 m)   Wt 70.3 kg   LMP 02/10/2018 (Approximate)   SpO2 100%   BMI 26.61 kg/m   FHT: n/a Toco: ctx every 1 min SVE: 5/80 per RN  UOP: 35-150 mL/hr last several hours  CBC Latest Ref Rng & Units 11/28/2018 11/28/2018 11/28/2018  WBC 4.0 - 10.5 K/uL 15.7(H) 16.1(H) 19.8(H)  Hemoglobin 12.0 - 15.0 g/dL 3.5(K) 7.9(L) 10.1(L)  Hematocrit 36.0 - 46.0 % 26.6(L) 23.4(L) 30.9(L)  Platelets 150 - 400 K/uL 71(L) 66(L) 110(L)   CMP Latest Ref Rng & Units 11/28/2018 11/28/2018 11/28/2018  Glucose 70 - 99 mg/dL 93 86 81  BUN 6 - 20 mg/dL 10 10 9   Creatinine 0.44 - 1.00 mg/dL 0.93(G) 1.82(X) 9.37(J)  Sodium 135 - 145 mmol/L 130(L) 129(L) 131(L)  Potassium 3.5 - 5.1 mmol/L 3.5 3.4(L) 3.6  Chloride 98 - 111 mmol/L 103 100 100  CO2 22 - 32 mmol/L 20(L) 21(L) 21(L)  Calcium 8.9 - 10.3 mg/dL 8.0(L) 8.1(L) 8.8(L)  Total Protein 6.5 - 8.1 g/dL 6.9(C) 6.0(L) 7.2  Total Bilirubin 0.3 - 1.2 mg/dL 7.8(L) 3.8(B) 2.7(H)  Alkaline Phos 38 - 126 U/L 396(H) 323(H) 325(H)  AST 15 - 41 U/L 38 36 46(H)  ALT 0 - 44 U/L 10 10 10    Fibrinogen: <60 --> 155 --> 161   A/P: Pt is 28 y.o. G3P1011 @ [redacted]w[redacted]d for TOLAC who is admitted for induction of labor for IUFD in the setting of preeclampsia with severe features and DIC. Last sent of labs stable, s/p 2 pools cryo, 2 units pRBCs, 1 pack PLT, 1 unit plasma. Foley bulb now out and she is making progress, will continue pitocin augmentation.   Cont MgSO4 2 g/hr Labs Q2, pending now Will replete coag factors prn Cont pitocin augmentation Cont NS infusion   K. Therese Sarah, M.D. Attending [facility] for [facility])

## 2018-11-28 NOTE — Progress Notes (Addendum)
LABOR PROGRESS NOTE  Christina Zavala is a 28 y.o. G3P1011 at [redacted]w[redacted]d admitted for abdominal pain and IUFD with DIC.  Patient has received: - 44g Mg, Pitosin 2x2 and 6x2. - Cryo x 2, pRBC x 2, PLTs x 1, FFP x 1 - Epidural  Subjective: Patient seen resting and relaxing in bed. She continues to have blurry, darkening vision with intermittent moderate headaches. Otherwise she has no concerns or complaints.   Objective: BP (!) 160/102   Pulse 82   Temp 97.7 F (36.5 C) (Oral)   Resp 18   Ht 5\' 4"  (1.626 m)   Wt 70.3 kg   LMP 02/10/2018 (Approximate)   SpO2 100%   BMI 26.61 kg/m  or  Vitals:   11/28/18 1401 11/28/18 1430 11/28/18 1501 11/28/18 1505  BP: (!) 156/85 (!) 142/101 (!) 173/97 (!) 160/102  Pulse: 69 79 85 82  Resp: 18 18  18   Temp: 98.3 F (36.8 C)   97.7 F (36.5 C)  TempSrc: Oral   Oral  SpO2: 100% 100%  100%  Weight:      Height:       Dilation: 6 Effacement (%): 80 Station: -1 Presentation: Vertex Exam by:: davis  FHT: none, IUFD Toco: Pitosin  Labs: Lab Results  Component Value Date   WBC 19.5 (H) 11/28/2018   HGB 11.6 (L) 11/28/2018   HCT 34.4 (L) 11/28/2018   MCV 88.4 11/28/2018   PLT 53 (L) 11/28/2018    Patient Active Problem List   Diagnosis Date Noted  . IUFD at 20 weeks or more of gestation 11/28/2018  . Chlamydia infection affecting pregnancy 04/20/2018  . Supervision of other normal pregnancy, antepartum 04/15/2018  . Previous cesarean delivery affecting pregnancy, antepartum 04/15/2018  . Achilles tendon tear, left, initial encounter 03/15/2018  . Iron deficiency anemia due to chronic blood loss 04/24/2017  . SAB (spontaneous abortion) 04/24/2017    Assessment / Plan: 28 y.o. G3P1011 at [redacted]w[redacted]d here for IUFD and DIC, pre-eclampsia. .  Labor: Latent Fetal Wellbeing:  IUFD Pain Control:  Epidural Anticipated MOD:  Vaginal  Peggyann Shoals, DO [facility], PGY-1 11/28/2018 3:38 PM  OB FELLOW LABOR PROGRESS NOTE  ATTESTATION  I have seen and examined this patient and agree with above documentation in the resident's note.   [name], MD  OB Fellow  11/29/2018, 4:41 PM

## 2018-11-28 NOTE — Progress Notes (Signed)
OB/GYN Faculty Practice: Labor Progress Note  Subjective: Comfortable with epidural but itching started after cryo. Christina Zavala still in room.   Objective: BP 131/82   Pulse 89   Temp 99 F (37.2 C) (Oral)   Resp 16   Ht 5\' 4"  (1.626 m)   Wt 70.3 kg   LMP 02/10/2018 (Approximate)   SpO2 99%   BMI 26.61 kg/m  Gen: tired appearing Dilation: 1 Presentation: Vertex Exam by:: Dr. Earlene Zavala  Assessment and Plan:Christina Zavala is a 28 y.o. G3P1011 at [redacted]w[redacted]d who presented with abdominal pain and was found to have intrauterine fetal demise in the setting of new diagnosis of severe preeclampsia.   Preeclampsia with Severe Features: Asymptomatic at this time. Moderate range BP. Decreased UOP (only 10cc last hour). Continue IVF.  -- Mg++  -- continue to monitor/treat BP closely -- will check labs every 4 hours   DIC: Now with bleeding from epidural site but not IV sites. Repeat labs stable but fibinogen < 60 so 10U cryoprecipitate transfused with goal of 200. FFP available, type and crossed 2U pRBCs as well. Platelets stable.  Some slightly elevated temps after cryo up now stable. Itching with administration but more likely related to increasing bili and epidural.  -- repeat coags at 0800    Induction  IUFD: FB still in place. TOLAC.  -- start pitocin  -- pain control: epidural in place  Christina Yazdi S. Earlene Plater, DO OB/GYN Fellow, Faculty Practice  6:00 AM

## 2018-11-28 NOTE — Progress Notes (Signed)
CSW received consult due to IUFD. CSW available for support secondary to spiral care services and will await call from chaplain. CSW available to meet with patient if chaplainn services is not available.    CSW screening out referral at this time.  Stacy Gardner, LCSW Clinical Social Worker  [facility]  [phone]

## 2018-11-28 NOTE — Anesthesia Pain Management Evaluation Note (Signed)
  CRNA Pain Management Visit Note  Patient: Christina Zavala, 28 y.o., female  "Hello I am a member of the anesthesia team at [facility]. We have an anesthesia team available at all times to provide care throughout the hospital, including epidural management and anesthesia for C-section. I don't know your plan for the delivery whether it a natural birth, water birth, IV sedation, nitrous supplementation, doula or epidural, but we want to meet your pain goals."   1.Was your pain managed to your expectations on prior hospitalizations?   No prior hospitalizations  2.What is your expectation for pain management during this hospitalization?     Epidural  3.How can we help you reach that goal? Maintain epidural until delivery.  Record the patient's initial score and the patient's pain goal.   Pain: 0  Pain Goal: 0 The [facility] wants you to be able to say your pain was always managed very well.  Christina Zavala 11/28/2018

## 2018-11-28 NOTE — Progress Notes (Addendum)
bakri balloon emptied by dr Earlene Platerdavis. Replaced by 480ml

## 2018-11-28 NOTE — Progress Notes (Signed)
 EBL by Dr Earlene Plater. Code PPH called.

## 2018-11-29 DIAGNOSIS — O45029 Premature separation of placenta with disseminated intravascular coagulation, unspecified trimester: Secondary | ICD-10-CM | POA: Diagnosis present

## 2018-11-29 DIAGNOSIS — O1490 Unspecified pre-eclampsia, unspecified trimester: Secondary | ICD-10-CM | POA: Diagnosis present

## 2018-11-29 DIAGNOSIS — Z98891 History of uterine scar from previous surgery: Secondary | ICD-10-CM

## 2018-11-29 DIAGNOSIS — O4590 Premature separation of placenta, unspecified, unspecified trimester: Secondary | ICD-10-CM | POA: Diagnosis present

## 2018-11-29 LAB — COMPREHENSIVE METABOLIC PANEL WITH GFR
ALT: 12 U/L (ref 0–44)
ALT: 12 U/L (ref 0–44)
AST: 30 U/L (ref 15–41)
AST: 32 U/L (ref 15–41)
Albumin: 2.1 g/dL — ABNORMAL LOW (ref 3.5–5.0)
Albumin: 2.5 g/dL — ABNORMAL LOW (ref 3.5–5.0)
Alkaline Phosphatase: 206 U/L — ABNORMAL HIGH (ref 38–126)
Alkaline Phosphatase: 194 U/L — ABNORMAL HIGH (ref 38–126)
Anion gap: 9 (ref 5–15)
Anion gap: 7 (ref 5–15)
BUN: 10 mg/dL (ref 6–20)
BUN: 10 mg/dL (ref 6–20)
CO2: 15 mmol/L — ABNORMAL LOW (ref 22–32)
CO2: 19 mmol/L — ABNORMAL LOW (ref 22–32)
Calcium: 6.2 mg/dL — CL (ref 8.9–10.3)
Calcium: 7.2 mg/dL — ABNORMAL LOW (ref 8.9–10.3)
Chloride: 106 mmol/L (ref 98–111)
Chloride: 104 mmol/L (ref 98–111)
Creatinine, Ser: 0.99 mg/dL (ref 0.44–1.00)
Creatinine, Ser: 1.04 mg/dL — ABNORMAL HIGH (ref 0.44–1.00)
GFR calc Af Amer: >60 mL/min
GFR calc Af Amer: >60 mL/min
GFR calc non Af Amer: >60 mL/min
GFR calc non Af Amer: >60 mL/min
Glucose, Bld: 90 mg/dL (ref 70–99)
Glucose, Bld: 104 mg/dL — ABNORMAL HIGH (ref 70–99)
Potassium: 3 mmol/L — ABNORMAL LOW (ref 3.5–5.1)
Potassium: 3.5 mmol/L (ref 3.5–5.1)
Sodium: 130 mmol/L — ABNORMAL LOW (ref 135–145)
Sodium: 130 mmol/L — ABNORMAL LOW (ref 135–145)
Total Bilirubin: 1 mg/dL (ref 0.3–1.2)
Total Bilirubin: 1.3 mg/dL — ABNORMAL HIGH (ref 0.3–1.2)
Total Protein: 3.9 g/dL — ABNORMAL LOW (ref 6.5–8.1)
Total Protein: 4.8 g/dL — ABNORMAL LOW (ref 6.5–8.1)

## 2018-11-29 LAB — PREPARE CRYOPRECIPITATE
Unit division: 0
Unit division: 0
Unit division: 0

## 2018-11-29 LAB — FIBRINOGEN
Fibrinogen: 226 mg/dL (ref 210–475)
Fibrinogen: 316 mg/dL (ref 210–475)
Fibrinogen: 379 mg/dL (ref 210–475)

## 2018-11-29 LAB — PREPARE FRESH FROZEN PLASMA
Unit division: 0
Unit division: 0

## 2018-11-29 LAB — CBC WITH DIFFERENTIAL/PLATELET
Basophils Absolute: 0 K/uL (ref 0.0–0.1)
Basophils Relative: 0 %
Eosinophils Absolute: 0 K/uL (ref 0.0–0.5)
Eosinophils Relative: 0 %
HCT: 18.9 % — ABNORMAL LOW (ref 36.0–46.0)
Hemoglobin: 6.4 g/dL — CL (ref 12.0–15.0)
Lymphocytes Relative: 7 %
Lymphs Abs: 0.8 K/uL (ref 0.7–4.0)
MCH: 30.3 pg (ref 26.0–34.0)
MCHC: 33.9 g/dL (ref 30.0–36.0)
MCV: 89.6 fL (ref 80.0–100.0)
Monocytes Absolute: 0.7 K/uL (ref 0.1–1.0)
Monocytes Relative: 6 %
Neutro Abs: 9.8 K/uL — ABNORMAL HIGH (ref 1.7–7.7)
Neutrophils Relative %: 87 %
Platelets: 65 K/uL — ABNORMAL LOW (ref 150–400)
RBC: 2.11 MIL/uL — ABNORMAL LOW (ref 3.87–5.11)
RDW: 14.2 % (ref 11.5–15.5)
WBC: 11.4 K/uL — ABNORMAL HIGH (ref 4.0–10.5)

## 2018-11-29 LAB — CBC
HCT: 25.2 % — ABNORMAL LOW (ref 36.0–46.0)
HCT: 26.4 % — ABNORMAL LOW (ref 36.0–46.0)
HCT: 24.4 % — ABNORMAL LOW (ref 36.0–46.0)
Hemoglobin: 9 g/dL — ABNORMAL LOW (ref 12.0–15.0)
Hemoglobin: 9.4 g/dL — ABNORMAL LOW (ref 12.0–15.0)
Hemoglobin: 8.7 g/dL — ABNORMAL LOW (ref 12.0–15.0)
MCH: 30.7 pg (ref 26.0–34.0)
MCH: 30.4 pg (ref 26.0–34.0)
MCH: 30.5 pg (ref 26.0–34.0)
MCHC: 35.7 g/dL (ref 30.0–36.0)
MCHC: 35.6 g/dL (ref 30.0–36.0)
MCHC: 35.7 g/dL (ref 30.0–36.0)
MCV: 86 fL (ref 80.0–100.0)
MCV: 85.4 fL (ref 80.0–100.0)
MCV: 85.6 fL (ref 80.0–100.0)
Platelets: 43 K/uL — ABNORMAL LOW (ref 150–400)
Platelets: 45 10*3/uL — ABNORMAL LOW (ref 150–400)
Platelets: 42 10*3/uL — ABNORMAL LOW (ref 150–400)
RBC: 2.93 MIL/uL — ABNORMAL LOW (ref 3.87–5.11)
RBC: 3.09 MIL/uL — ABNORMAL LOW (ref 3.87–5.11)
RBC: 2.85 MIL/uL — ABNORMAL LOW (ref 3.87–5.11)
RDW: 14.1 % (ref 11.5–15.5)
RDW: 14.4 % (ref 11.5–15.5)
RDW: 14.5 % (ref 11.5–15.5)
WBC: 13.9 K/uL — ABNORMAL HIGH (ref 4.0–10.5)
WBC: 14.4 10*3/uL — ABNORMAL HIGH (ref 4.0–10.5)
WBC: 12.7 10*3/uL — AB (ref 4.0–10.5)
nRBC: 0 % (ref 0.0–0.2)
nRBC: 0 % (ref 0.0–0.2)
nRBC: 0 % (ref 0.0–0.2)

## 2018-11-29 LAB — BPAM CRYOPRECIPITATE
Blood Product Expiration Date: 202001061005
Blood Product Expiration Date: 202001061231
Blood Product Expiration Date: 202001070128
ISSUE DATE / TIME: 202001060434
ISSUE DATE / TIME: 202001060656
ISSUE DATE / TIME: 202001061946
Unit Type and Rh: 6200
Unit Type and Rh: 6200
Unit Type and Rh: 6200

## 2018-11-29 LAB — MAGNESIUM
Magnesium: 3.5 mg/dL — ABNORMAL HIGH (ref 1.7–2.4)
Magnesium: 4.2 mg/dL — ABNORMAL HIGH (ref 1.7–2.4)
Magnesium: 4.4 mg/dL — ABNORMAL HIGH (ref 1.7–2.4)
Magnesium: 4.5 mg/dL — ABNORMAL HIGH (ref 1.7–2.4)

## 2018-11-29 LAB — PREPARE PLATELET PHERESIS
UNIT DIVISION: 0
Unit division: 0

## 2018-11-29 LAB — BPAM FFP
Blood Product Expiration Date: 202001112359
Blood Product Expiration Date: 202001112359
ISSUE DATE / TIME: 202001061224
ISSUE DATE / TIME: 202001061910
Unit Type and Rh: 7300
Unit Type and Rh: 7300

## 2018-11-29 LAB — COMPREHENSIVE METABOLIC PANEL
ALBUMIN: 2.6 g/dL — AB (ref 3.5–5.0)
ALT: 12 U/L (ref 0–44)
AST: 28 U/L (ref 15–41)
Alkaline Phosphatase: 181 U/L — ABNORMAL HIGH (ref 38–126)
Anion gap: 5 (ref 5–15)
BUN: 8 mg/dL (ref 6–20)
CO2: 22 mmol/L (ref 22–32)
Calcium: 7.3 mg/dL — ABNORMAL LOW (ref 8.9–10.3)
Chloride: 107 mmol/L (ref 98–111)
Creatinine, Ser: 0.83 mg/dL (ref 0.44–1.00)
GFR calc Af Amer: >60 mL/min (ref >60)
GFR calc non Af Amer: >60 mL/min (ref >60)
Glucose, Bld: 87 mg/dL (ref 70–99)
POTASSIUM: 3.6 mmol/L (ref 3.5–5.1)
Sodium: 134 mmol/L — ABNORMAL LOW (ref 135–145)
Total Bilirubin: 0.8 mg/dL (ref 0.3–1.2)
Total Protein: 4.8 g/dL — ABNORMAL LOW (ref 6.5–8.1)

## 2018-11-29 LAB — BPAM PLATELET PHERESIS
Blood Product Expiration Date: 202001062359
Blood Product Expiration Date: 202001082359
ISSUE DATE / TIME: 202001061021
ISSUE DATE / TIME: 202001061942
Unit Type and Rh: 8400
Unit Type and Rh: 6200

## 2018-11-29 LAB — PROTIME-INR
INR: 1.22
INR: 1.4
Prothrombin Time: 15.3 seconds — ABNORMAL HIGH (ref 11.4–15.2)
Prothrombin Time: 17 seconds — ABNORMAL HIGH (ref 11.4–15.2)

## 2018-11-29 LAB — APTT
aPTT: 28 seconds (ref 24–36)
aPTT: 29 seconds (ref 24–36)

## 2018-11-29 LAB — PREPARE RBC (CROSSMATCH)

## 2018-11-29 LAB — HAPTOGLOBIN: Haptoglobin: 12 mg/dL — ABNORMAL LOW (ref 33–278)

## 2018-11-29 MED ORDER — ONDANSETRON HCL 4 MG/2ML IJ SOLN: 4.0000 mg | INTRAMUSCULAR | Status: DC | PRN

## 2018-11-29 MED ORDER — TETANUS-DIPHTH-ACELL PERTUSSIS 5-2.5-18.5 LF-MCG/0.5 IM SUSP: 0.5000 mL | Freq: Once | INTRAMUSCULAR | Status: DC

## 2018-11-29 MED ORDER — ZOLPIDEM TARTRATE 5 MG PO TABS: 5.0000 mg | ORAL_TABLET | Freq: Every evening | ORAL | Status: DC | PRN

## 2018-11-29 MED ORDER — DIBUCAINE 1 % RE OINT: 1.0000 "application " | TOPICAL_OINTMENT | RECTAL | Status: DC | PRN

## 2018-11-29 MED ORDER — DIPHENHYDRAMINE HCL 25 MG PO CAPS: 25.0000 mg | ORAL_CAPSULE | Freq: Four times a day (QID) | ORAL | Status: DC | PRN

## 2018-11-29 MED ORDER — PRENATAL MULTIVITAMIN CH
1.0000 | ORAL_TABLET | Freq: Every day | ORAL | Status: DC
  Administered 2018-11-30: 1 via ORAL
  Filled 2018-11-29: qty 1

## 2018-11-29 MED ORDER — ACETAMINOPHEN 325 MG PO TABS
650.0000 mg | ORAL_TABLET | ORAL | Status: DC | PRN
  Administered 2018-11-30: 650 mg via ORAL
  Filled 2018-11-29: qty 2

## 2018-11-29 MED ORDER — NIFEDIPINE ER OSMOTIC RELEASE 30 MG PO TB24
30.0000 mg | ORAL_TABLET | Freq: Two times a day (BID) | ORAL | Status: DC
  Administered 2018-11-29 – 2018-12-01 (×4): 30 mg via ORAL
  Filled 2018-11-29 (×4): qty 1

## 2018-11-29 MED ORDER — SENNOSIDES-DOCUSATE SODIUM 8.6-50 MG PO TABS
2.0000 | ORAL_TABLET | ORAL | Status: DC
  Administered 2018-11-30 (×2): 2 via ORAL
  Filled 2018-11-29 (×3): qty 2

## 2018-11-29 MED ORDER — SIMETHICONE 80 MG PO CHEW: 80.0000 mg | CHEWABLE_TABLET | ORAL | Status: DC | PRN

## 2018-11-29 MED ORDER — BENZOCAINE-MENTHOL 20-0.5 % EX AERO: 1.0000 "application " | INHALATION_SPRAY | CUTANEOUS | Status: DC | PRN

## 2018-11-29 MED ORDER — SODIUM CHLORIDE 0.9% IV SOLUTION: Freq: Once | INTRAVENOUS | Status: DC

## 2018-11-29 MED ORDER — COCONUT OIL OIL: 1.0000 "application " | TOPICAL_OIL | Status: DC | PRN

## 2018-11-29 MED ORDER — ONDANSETRON HCL 4 MG PO TABS: 4.0000 mg | ORAL_TABLET | ORAL | Status: DC | PRN

## 2018-11-29 MED ORDER — OXYCODONE HCL 5 MG PO TABS
5.0000 mg | ORAL_TABLET | ORAL | Status: DC | PRN
  Administered 2018-11-30: 5 mg via ORAL
  Filled 2018-11-29: qty 1

## 2018-11-29 MED ORDER — OXYCODONE HCL 5 MG PO TABS
10.0000 mg | ORAL_TABLET | ORAL | Status: DC | PRN
  Administered 2018-11-29: 10 mg via ORAL
  Filled 2018-11-29: qty 2

## 2018-11-29 MED ORDER — WITCH HAZEL-GLYCERIN EX PADS: 1.0000 "application " | MEDICATED_PAD | CUTANEOUS | Status: DC | PRN

## 2018-11-29 NOTE — Anesthesia Postprocedure Evaluation (Signed)
Anesthesia Post Note  Patient: Christina Zavala  Procedure(s) Performed: AN AD HOC LABOR EPIDURAL     Patient location during evaluation: [facility] Anesthesia Type: Epidural Level of consciousness: awake Pain management: pain level controlled Vital Signs Assessment: post-procedure vital signs reviewed and stable Respiratory status: spontaneous breathing Cardiovascular status: stable Postop Assessment: patient able to bend at knees and epidural receding Anesthetic complications: no Comments: Epidural intact due to low platelet count;RN to call if anesthesia needed to pull epidural when platelets increased    Last Vitals:  Vitals:   11/29/18 1147 11/29/18 1253  BP: (!) 143/75 (!) 139/92  Pulse: (!) 57 67  Resp: 16 16  Temp: 36.8 C 36.7 C  SpO2: 100% 95%    Last Pain:  Vitals:   11/29/18 1410  TempSrc:   PainSc: 10-Worst pain ever   Pain Goal:                 Edison Pace

## 2018-11-29 NOTE — Progress Notes (Signed)
Discussed most recent labs with Dr. Marlis Edelson. Providers will discuss pt at round; will decide when and where pt is transferred/update POC.

## 2018-11-29 NOTE — Progress Notes (Signed)
Post Partum Day 1 Subjective: no complaints  Objective: Blood pressure (!) 166/80, pulse (!) 58, temperature 97.6 F (36.4 C), temperature source Oral, resp. rate 20, height 5\' 4"  (1.626 m), weight 70.3 kg, last menstrual period 02/10/2018, SpO2 100 %.  Physical Exam:  General: alert, cooperative and no distress Lochia: appropriate Uterine Fundus: firm Incision: n/a DVT Evaluation: No evidence of DVT seen on physical exam.  Recent Labs    11/29/18 0955 11/29/18 1405  HGB 9.4* 8.7*  HCT 26.4* 24.4*  Bakrii balloon deflated and removed  Assessment/Plan: D/C Mag Procardia 30 mg XL BID   LOS: 1 day   Scheryl DarterJames Arnold 11/29/2018, 9:11 PM

## 2018-11-29 NOTE — Progress Notes (Signed)
OB/GYN Faculty Practice: Postpartum Interval Progress Note  Subjective: Late entry. Doing well. Intermittently tearful, denies pain. Partner Alex still at bedside.   Objective: BP 129/71   Pulse 75   Temp 98.9 F (37.2 C) (Oral)   Resp 15   Ht 5\' 4"  (1.626 m)   Wt 70.3 kg   LMP 02/10/2018 (Approximate)   SpO2 100%   BMI 26.61 kg/m  Gen: tired appearing, shivering  Dilation: 10 Dilation Complete Date: 11/28/18 Dilation Complete Time: 1844 Effacement (%): 80 Station: 0 Presentation: Vertex Exam by:: Deatra Robinson, RN   Assessment and Plan: 28 y.o. [id number] [redacted]w[redacted]d who is about a few hours postpartum from VBAC of IUFD. See delivery note and H&P for additional details.   Preeclampsia with Severe Features: BP low to moderate range. Received meds per anesthesia and BP now stable. No longer having blurry vision. Mg++ level elevated to 12 immediately postpartum. Adequate urine output, no signs of respiratory compromise. Given calcium gluconate 1g, Mg++ paused and then repeat Mg++ 3.5.  -- restart Mg++ at 1g/hr x 24hrs postpartum  -- will recheck labs at 0700 -- continue to monitor BP closely   DIC  Postpartum Hemorrhage: DIC prior to delivery likely related to bleeding from placental abruption related to severe preeclampsia. See delivery note for additional details regarding management of PPH. Bakri balloon still in place, fundus about +3. Minimal bleeding noted around balloon. VSS.HgB 5.8>6.4 after >3L EBL, likely stabilized and will recheck in a few hours now that all products completed. Fibrinogen improved to 226. Received Ancef for elevated temp and intrauterine manipulation during  -- Massive transfusion protocol: 7U pRBCS, 4U cryo, 2U FFP, 2U platelets  -- repeat DIC labs 0700  -- keep Bakri in place x 24 hours  Dispo: Will transfer to [facility] later this morning assuming stable VS and labs  Kyan Giannone S. Earlene Plater, DO OB/GYN Fellow, Faculty Practice  3:27 AM

## 2018-11-29 NOTE — Progress Notes (Signed)
Name: Christina Zavala  Location: [facility] of Visit: Follow Up Care   Summary:  Chaplain stopped by to answer questions relating to funeral homes. Pt reported that she didn't need to talk however wanted to get more information about burial. After speaking with the Pt's RN chaplain found out the Pt had chosen a [facility] already however, Pt had forgot. When chaplain asked Pt about the [facility] she chosen Pt said "Ronalee Belts, that one!" Chaplain still provided a list of funerals homes in the area. But, Pt is leaning towards [facility] in [place].

## 2018-11-29 NOTE — Progress Notes (Signed)
Follow up visit to offer spiritual support after Allie's delivery.  Pt reports her vision is much better and almost back to normal and that she feels like she is coping pretty well-much better than when she first came in.  I affirmed her and reminded her that if that changes or ebbs and flows that is also appropriate.    Please page as further needs arise.  Maryanna Shape. Carley Hammed, M.Div. [facility] Chaplain Pager [phone] Office [phone]

## 2018-11-29 NOTE — Progress Notes (Addendum)
Faculty Attending Note  Post Partum Day 1  Subjective: Patient is feeling better, does report pain in her bottom. She reports fairly well controlled pain on PO pain meds. She is not ambulating and denies light-headedness or dizziness. She is passing flatus. She is tolerating a clear liquid diet and eager to advance, denies nausea/vomiting. Bleeding is moderate. States her vision is back to normal.  Per RN, she has been appropriately grieving the loss of her baby.  Objective: Blood pressure 137/67, pulse 72, temperature 98.7 F (37.1 C), temperature source Axillary, resp. rate 16, height 5\' 4"  (1.626 m), weight 70.3 kg, last menstrual period 02/10/2018, SpO2 100 %. Temp:  [97.7 F (36.5 C)-101.1 F (38.4 C)] 98.7 F (37.1 C) (01/07 0430) Pulse Rate:  [60-281] 72 (01/07 0700) Resp:  [12-20] 16 (01/07 0700) BP: (37-173)/(26-103) 137/67 (01/07 0700) SpO2:  [80 %-100 %] 100 % (01/07 0700)  Physical Exam:  General: alert, oriented, cooperative Chest: CTAB, normal respiratory effort Heart: RRR  Abdomen: +BS, soft, appropriately tender to palpation  Uterine Fundus: firm, at the umbilicus Lochia: moderate, rubra DVT Evaluation: no evidence of DVT Extremities: no edema, no calf tenderness  UOP: 100-800 mL/hr  Current Facility-Administered Medications:  .  0.9 %  sodium chloride infusion (Manually program via Guardrails IV Fluids), , Intravenous, Once, Wallace, Laurel S, DO .  0.9 %  sodium chloride infusion (Manually program via Guardrails IV Fluids), , Intravenous, Once, Conan Bowens, MD .  0.9 %  sodium chloride infusion (Manually program via Guardrails IV Fluids), , Intravenous, Once, Conan Bowens, MD .  0.9 %  sodium chloride infusion (Manually program via Guardrails IV Fluids), , Intravenous, Once, Conan Bowens, MD .  0.9 %  sodium chloride infusion (Manually program via Guardrails IV Fluids), , Intravenous, Once, Conan Bowens, MD .  0.9 %  sodium chloride infusion (Manually  program via Guardrails IV Fluids), , Intravenous, Once, Conan Bowens, MD .  0.9 %  sodium chloride infusion (Manually program via Guardrails IV Fluids), , Intravenous, Once, Conan Bowens, MD .  0.9 %  sodium chloride infusion (Manually program via Guardrails IV Fluids), , Intravenous, Once, Conan Bowens, MD .  0.9 %  sodium chloride infusion, , Intravenous, Continuous, Conan Bowens, MD, Last Rate: 100 mL/hr at 11/29/18 8315 .  acetaminophen (OFIRMEV) IV 1,000 mg, 1,000 mg, Intravenous, Q6H, Myrene Buddy, MD, Last Rate: 400 mL/hr at 11/29/18 0341, 1,000 mg at 11/29/18 0341 .  diphenhydrAMINE (BENADRYL) injection 12.5 mg, 12.5 mg, Intravenous, Q15 min PRN, Woodrum, Chelsey L, MD .  diphenhydrAMINE (BENADRYL) injection 25 mg, 25 mg, Intravenous, Once, Wallace, Laurel S, DO .  ePHEDrine injection 10 mg, 10 mg, Intravenous, PRN, Woodrum, Chelsey L, MD .  ePHEDrine injection 10 mg, 10 mg, Intravenous, PRN, Woodrum, Chelsey L, MD, 10 mg at 11/28/18 2059 .  fentaNYL (SUBLIMAZE) injection 100 mcg, 100 mcg, Intravenous, Q1H PRN, Earlene Plater, Laurel S, DO .  fentaNYL 2.5 mcg/ml w/bupivacaine 0.1% in NS epidural infusion (WH-ANES), 14 mL/hr, Epidural, Continuous PRN, Woodrum, Chelsey L, MD, Last Rate: 14 mL/hr at 11/28/18 1834, 14 mL/hr at 11/28/18 1834 .  labetalol (NORMODYNE,TRANDATE) injection 20 mg, 20 mg, Intravenous, PRN, 20 mg at 11/28/18 1539 **AND** labetalol (NORMODYNE,TRANDATE) injection 40 mg, 40 mg, Intravenous, PRN **AND** labetalol (NORMODYNE,TRANDATE) injection 80 mg, 80 mg, Intravenous, PRN **AND** hydrALAZINE (APRESOLINE) injection 10 mg, 10 mg, Intravenous, PRN **AND** Measure blood pressure, , , Once, Wallace, Laurel S, DO .  lactated  ringers infusion 500 mL, 500 mL, Intravenous, Once, Woodrum, Chelsey L, MD .  lactated ringers infusion 500-1,000 mL, 500-1,000 mL, Intravenous, PRN, Earlene Plater, Laurel S, DO .  lidocaine (PF) (XYLOCAINE) 1 % injection 30 mL, 30 mL, Subcutaneous, PRN,  Earlene Plater, Laurel S, DO .  magnesium sulfate 40 grams in LR 500 mL OB infusion, 1 g/hr, Intravenous, Continuous, Conan Bowens, MD, Last Rate: 12.5 mL/hr at 11/29/18 0220, 1 g/hr at 11/29/18 0220 .  ondansetron (ZOFRAN) injection 4 mg, 4 mg, Intravenous, Q6H PRN, Earlene Plater, Laurel S, DO .  oxytocin (PITOCIN) IV infusion 40 units in LR 1000 mL - Premix, 2.5 Units/hr, Intravenous, Continuous, Wallace, Laurel S, DO, Stopped at 11/29/18 0229 .  oxytocin (PITOCIN) IV infusion 40 units in LR 1000 mL - Premix, 1-40 milli-units/min, Intravenous, Titrated, Conan Bowens, MD, Last Rate: 9 mL/hr at 11/28/18 1151, 6 milli-units/min at 11/28/18 1151 .  PHENYLephrine 40 mcg/ml in normal saline Adult IV Push Syringe, 80 mcg, Intravenous, PRN, Woodrum, Chelsey L, MD, 80 mcg at 11/28/18 2132 .  sodium citrate-citric acid (ORACIT) solution 30 mL, 30 mL, Oral, Q2H PRN, Earlene Plater, Laurel S, DO .  sodium phosphate (FLEET) 7-19 GM/118ML enema 1 enema, 1 enema, Rectal, PRN, Rhett Bannister S, DO CBC Latest Ref Rng & Units 11/29/2018 11/29/2018 11/28/2018  WBC 4.0 - 10.5 K/uL 13.9(H) 11.4(H) 8.7  Hemoglobin 12.0 - 15.0 g/dL 9.0(L) 6.4(LL) 5.8(LL)  Hematocrit 36.0 - 46.0 % 25.2(L) 18.9(L) 17.5(L)  Platelets 150 - 400 K/uL 43(L) 65(L) 31(L)   CMP Latest Ref Rng & Units 11/29/2018 11/29/2018 11/28/2018  Glucose 70 - 99 mg/dL 917(H) 90 86  BUN 6 - 20 mg/dL 10 10 8   Creatinine 0.44 - 1.00 mg/dL 1.50(V) 6.97 9.48  Sodium 135 - 145 mmol/L 130(L) 130(L) 131(L)  Potassium 3.5 - 5.1 mmol/L 3.5 3.0(L) 2.9(L)  Chloride 98 - 111 mmol/L 104 106 108  CO2 22 - 32 mmol/L 19(L) 15(L) 11(L)  Calcium 8.9 - 10.3 mg/dL 7.2(L) 6.2(LL) 4.9(LL)  Total Protein 6.5 - 8.1 g/dL 4.8(L) 3.9(L) 4.3(L)  Total Bilirubin 0.3 - 1.2 mg/dL 0.1(K) 1.0 1.0  Alkaline Phos 38 - 126 U/L 194(H) 206(H) 285(H)  AST 15 - 41 U/L 32 30 26  ALT 0 - 44 U/L 12 12 6      Assessment/Plan:  Patient is 28 y.o. [id number] PPD#1 s/p SVD at [redacted]w[redacted]d of IUFD, c/b pre-eclampsia with severe  features, placental abruption, DIC, AKI. Has received multiple uterotonics and has bakri balloon in place. Will plan to start deflating this am. S/p transfusion of 7 units pRBCs, 4 pool cryo, 2 units FFP, 2 unit PLT. Stable labs this am, will repeat later today. Mag was turned off and patient received 1 g calcium gluconate for Mag level of 12.0, restarted with subsequent Mag level of 3. She is feeling much better this am, appears much improved from last evening.   Transfer to [facility] Cont MgSO4 Will start to decrease bakri balloon today Repeat labs for 1400  Advance diet as tolerated Pain meds prn  K. Therese Sarah, M.D. Attending Center for [employer] (Faculty Practice)  11/29/2018, 7:40 AM

## 2018-11-30 LAB — BPAM FFP
Blood Product Expiration Date: 202001112359
ISSUE DATE / TIME: 202001070012
Unit Type and Rh: 7300

## 2018-11-30 LAB — PREPARE CRYOPRECIPITATE: Unit division: 0

## 2018-11-30 LAB — BPAM CRYOPRECIPITATE
Blood Product Expiration Date: 202001070350
ISSUE DATE / TIME: 202001070010
Unit Type and Rh: 5100

## 2018-11-30 LAB — CBC
HCT: 27.9 % — ABNORMAL LOW (ref 36.0–46.0)
HEMATOCRIT: 26.7 % — AB (ref 36.0–46.0)
Hemoglobin: 9.3 g/dL — ABNORMAL LOW (ref 12.0–15.0)
Hemoglobin: 9.8 g/dL — ABNORMAL LOW (ref 12.0–15.0)
MCH: 30.4 pg (ref 26.0–34.0)
MCH: 30.9 pg (ref 26.0–34.0)
MCHC: 34.8 g/dL (ref 30.0–36.0)
MCHC: 35.1 g/dL (ref 30.0–36.0)
MCV: 87.3 fL (ref 80.0–100.0)
MCV: 88 fL (ref 80.0–100.0)
PLATELETS: 82 10*3/uL — AB (ref 150–400)
Platelets: 72 10*3/uL — ABNORMAL LOW (ref 150–400)
RBC: 3.06 MIL/uL — ABNORMAL LOW (ref 3.87–5.11)
RBC: 3.17 MIL/uL — ABNORMAL LOW (ref 3.87–5.11)
RDW: 14.9 % (ref 11.5–15.5)
RDW: 14.8 % (ref 11.5–15.5)
WBC: 14 10*3/uL — ABNORMAL HIGH (ref 4.0–10.5)
WBC: 13 10*3/uL — ABNORMAL HIGH (ref 4.0–10.5)
nRBC: 0.5 % — ABNORMAL HIGH (ref 0.0–0.2)
nRBC: 0.3 % — ABNORMAL HIGH (ref 0.0–0.2)

## 2018-11-30 LAB — PREPARE FRESH FROZEN PLASMA: Unit division: 0

## 2018-11-30 MED ORDER — ENALAPRIL MALEATE 10 MG PO TABS: 10.0000 mg | ORAL_TABLET | Freq: Every day | ORAL | 5 refills | Status: AC

## 2018-11-30 MED ORDER — IBUPROFEN 600 MG PO TABS: 600.0000 mg | ORAL_TABLET | Freq: Four times a day (QID) | ORAL | 0 refills | Status: AC | PRN

## 2018-11-30 MED ORDER — ENALAPRIL MALEATE 10 MG PO TABS
10.0000 mg | ORAL_TABLET | Freq: Every day | ORAL | Status: DC
  Administered 2018-11-30 – 2018-12-01 (×2): 10 mg via ORAL
  Filled 2018-11-30 (×3): qty 1

## 2018-11-30 MED ORDER — NIFEDIPINE ER 30 MG PO TB24: 30.0000 mg | ORAL_TABLET | Freq: Two times a day (BID) | ORAL | 1 refills | Status: AC

## 2018-11-30 NOTE — Progress Notes (Addendum)
Discharge instructions given to patient, postpartum care,meds, and follow-up reviewed.  Pre-eclampsia s/s reviewed with patient stating that she is having blurred vision, Dr. Alysia Penna notified in surgery and will come and assess her but for now, cancel discharge. Patient informed of cancel discharge, patient continues to state that she needs to leave. AMA discussed with patient and potential risks for seizure, stroke or death. Patient agrees to stay the night for continued evaluation of blood pressures.

## 2018-11-30 NOTE — Progress Notes (Signed)
OB Attending  See nurses previous note  Pt feels better now. Some mild blurry vision and slight HA. Ambulating and voiding without problems. Bleeding min. Pain controlled Tolerating diet  PE  Unchanged from this morning  A/P Stable Discussed reasoning for canceling discharge. Pt voiced understanding. Will continue with current management. Hopefully for discharge home tomorrow if remains satble

## 2018-11-30 NOTE — Progress Notes (Signed)
I followed up with Christina Zavala today.  She is very focused on going home and is aware that if she talks about the death of her baby that she may become emotional to the point of making herself physically sick and she is eager to get home to her 30 year old son.  She lost twins 2 years ago and staying focused on her son was what helped her to cope.  Her family has encouraged her to do a cremation rather than a burial for financial reasons.  I asked her if she wanted to talk about some potential sources of financial assitance and she said that she has made her peace with cremation.    I provided her with information for resources for support for her and for her son.  Chaplain Dyanne Carrel, [facility] Pager, [phone] 4:10 PM    11/30/18 1600  Clinical Encounter Type  Visited With Patient  Visit Type Spiritual support  Referral From Nurse  Spiritual Encounters  Spiritual Needs Emotional;Grief support

## 2018-11-30 NOTE — Lactation Note (Addendum)
Lactation Consultation Note  Patient Name: Jenina Bata UEAVW'U Date: 11/30/2018   Ms. Papp's milk has come to volume (she is about 36 hrs postpartum). This is Ms. Doggett's 2nd child. Ms. Fixico did not nurse her 1st child, but states that she had a lot of milk when her milk came to volume with her 1st child.  Ms. Diers believes she would have been eligible for [facility]. The [facility] After a Loss handout was provided. Guidelines for breast management during the grieving process were provided to patient. The mention of ibuprofen and sage tea were stricken from the handout.  A hand pump was provided so that Mom can express if she begins to become uncomfortably full. Mom understands that the pump is not to be used to empty her breasts, but to express some milk for her comfort.  Mom was shown how to use the hand pump; a size 24 flange appears appropriate for her.    Lurline Hare [facility] 11/30/2018, 8:40 AM

## 2018-11-30 NOTE — Discharge Instructions (Signed)
Coping with Pregnancy Loss °Pregnancy loss can happen any time during a pregnancy. Often the cause is not known. It is rarely because of anything you did. Pregnancy loss in early pregnancy (during the first trimester) is called a miscarriage. This type of pregnancy loss is the most common. Pregnancy loss that happens after 20 weeks of pregnancy is called fetal demise if the baby's heart stops beating before birth. Fetal demise is much less common. Some women experience spontaneous labor shortly after fetal demise resulting in a stillborn birth (stillbirth). °Any pregnancy loss can be devastating. You will need to recover both physically and emotionally. Most women are able to get pregnant again after a pregnancy loss and deliver a healthy baby. °How to manage emotional recovery ° °Pregnancy loss is very hard emotionally. You may feel many different emotions while you grieve. You may feel sad and angry. You may also feel guilty. It is normal to have periods of crying. Emotional recovery can take longer than physical recovery. It is different for everyone. °Taking these steps can help you cope: °· Remember that it is unlikely you did anything to cause the pregnancy loss. °· Share your thoughts and feelings with friends, family, and your partner. Remember that your partner is also recovering emotionally. °· Make sure you have a good support system, and do not spend too much time alone. °· Meet with a pregnancy loss counselor or join a pregnancy loss support group. °· Get enough sleep and eat a healthy diet. Return to regular exercise when you have recovered physically. °· Do not use drugs or alcohol to manage your emotions. °· Consider seeing a mental health professional to help you recover emotionally. °· Ask a friend or loved one to help you decide what to do with any clothing and nursery items you received for your baby. °In the case of a stillbirth, many women benefit from taking additional steps in the grieving  process. You may want to: °· Hold your baby after the birth. °· Name your baby. °· Request a birth certificate. °· Create a keepsake such as handprints or footprints. °· Dress your baby and have a picture taken. °· Make funeral arrangements. °· Ask for a baptism or blessing. °Hospitals have staff members who can help you with all these arrangements. °How to recognize emotional stress °It is normal to have emotional stress after a pregnancy loss. But emotional stress that lasts a long time or becomes severe requires treatment. Watch out for these signs of severe emotional stress: °· Sadness, anger, or guilt that is not going away and is interfering with your normal activities. °· Relationship problems that have occurred or gotten worse since the pregnancy loss. °· Signs of depression that last longer than 2 weeks. These may include: °? Sadness. °? Anxiety. °? Hopelessness. °? Loss of interest in activities you enjoy. °? Inability to concentrate. °? Trouble sleeping or sleeping too much. °? Loss of appetite or overeating. °? Thoughts of death or of hurting yourself. °Follow these instructions at home: °Medicines °· Take over-the-counter and prescription medicines only as told by your health care provider. °Activity °· Rest at home until your energy level returns. Return to your normal activities as told by your health care provider. Ask your health care provider what activities are safe for you. °General instructions °· Keep all follow-up visits as told by your health care provider. This is important. °· It may be helpful to meet with others who have experienced pregnancy loss. Ask your health   care provider about support groups and resources.  To help you and your partner with the process of grieving, talk with your health care provider or seek counseling.  When you are ready, meet with your health care provider to discuss steps to take for a future pregnancy. Where to find more information  [place] Department of  Health and Cytogeneticist on Women's Health: http://hoffman.com/  American Pregnancy Association: www.americanpregnancy.org Contact a health care provider if:  You continue to experience grief, sadness, or lack of motivation for everyday activities, and those feelings do not improve over time.  You are struggling to recover emotionally, especially if you are using alcohol or substances to help. Get help right away if:  You have thoughts of hurting yourself or others. If you ever feel like you may hurt yourself or others, or have thoughts about taking your own life, get help right away. You can go to your nearest emergency department or call:  Your local emergency services (911 in the [place]  A suicide crisis helpline, such as the National Suicide Prevention Lifeline at 520-063-7451. This is open 24 hours a day. Summary  Any pregnancy loss can be difficult physically and emotionally.  You may experience many different emotions while you grieve. Emotional recovery can last longer than physical recovery.  It is normal to have emotional stress after a pregnancy loss. But emotional stress that lasts a long time or becomes severe requires treatment.  See your health care provider if you are struggling emotionally after a pregnancy loss. This information is not intended to replace advice given to you by your health care provider. Make sure you discuss any questions you have with your health care provider. Document Released: 01/20/2018 Document Revised: 01/20/2018 Document Reviewed: 01/20/2018 Elsevier Interactive Patient Education  2019 Elsevier Inc.   Vaginal Delivery, Care After This sheet gives you information about how to care for yourself after your delivery. Your health care provider may also give you more specific instructions. If you have problems or questions, contact your health care provider. What can I expect after the procedure? After delivery, it is common to  have:  Soreness in your abdomen, your vagina, and the area between the opening of your vagina and your anus (perineum).  Tiredness (fatigue).  Cramps.  Breast tenderness related to engorgement.  Some bleeding and discharge from your vagina. This may continue for about 6 weeks. The bleeding and discharge will start out red, then become pink, then yellow, and finally white.  Tenderness in your vagina or perineum if you had an episiotomy or a vaginal tear. This may last several weeks.  Emotions that change quickly. Common emotions include: ? Sadness. ? Anger. ? Denial. ? Guilt. ? Depression. Follow these instructions at home: Vaginal and perineal care  Keep your perineum clean and dry as told by your health care provider.  Wipe from front to back when you use the toilet.  If you have an episiotomy or a vaginal tear, check for signs of infection, such as: ? Increasing redness, swelling, or pain in your perineal area. ? Pus or bad-smelling discharge coming from your wound or vagina.  To relieve pain at the wound area or pain caused by hemorrhoids, try taking a warm sitz bath 2-3 times a day.  Do not use tampons or douche until your health care provider says it is okay. Medicines  Take over-the-counter and prescription medicines only as told by your health care provider.  If you were prescribed an antibiotic  medicine, take it as told by your health care provider. Do not stop taking the antibiotic even if you start to feel better. Eating and drinking   Drink enough fluids to keep your urine pale yellow.  Eat high-fiber foods every day. These foods may help prevent or relieve constipation. High-fiber foods include: ? Whole grain cereals and breads. ? Brown rice. ? Beans. ? Fresh fruits and vegetables. Activity  If possible, have someone help you with your household activities for at least a few days after you leave the hospital.  Return to your normal activities as told  by your health care provider. Ask your health care provider what activities are safe for you.  Rest as much as possible.  Talk with your health care provider about when you can engage in sexual activity. This may depend on your: ? Risk of infection. ? Rate of healing. ? Comfort and desire to engage in sexual activity. Emotional support  Consider seeking support for your loss. Some forms of support that you might consider include your religious leader, friends, family, a Pharmacist, hospitalprofessional counselor, or a bereavement support group. General instructions  Wear a supportive and well-fitting bra.  If you pass a blood clot, save it and call your health care provider to discuss. Do not flush blood clots down the toilet before you get instructions from your health care provider.  Keep all of your scheduled postpartum visits. At these visits, your health care provider will check to make sure that you are healing, both physically and emotionally. Contact a health care provider if:  You feel sad or depressed.  You are having trouble eating or sleeping.  You lose interest in activities you used to enjoy.  You pass a blood clot from your vagina.  You have pus or a bad-smelling discharge coming from your wound or vagina.  You have increasing redness, swelling, or pain in your perineal area.  You feel pain or burning when you urinate.  You urinate more often than normal.  You have a fever.  Your breasts become hard, red, or painful.  You are dizzy or light-headed.  You have a rash.  You feel nauseous or you vomit.  You have not had a menstrual period by the 12th week after delivery. Get help right away if:  You have persistent pain that is not relieved by comfort measures or medicines.  You have chest pain or trouble breathing.  You have blurred vision or you see spots.  You have a severe headache.  You faint.  You have sudden, severe leg pain.  You bleed from your vagina so  much that you fill more than one sanitary pad in one hour. Bleeding should not be heavier than your heaviest period.  You have thoughts of hurting yourself. If you ever feel like you may hurt yourself or others, or have thoughts about taking your own life, get help right away. You can go to your nearest emergency department or call:  Your local emergency services (911 in the [place]  A suicide crisis helpline, such as the National Suicide Prevention Lifeline at 228-292-43831-323-428-2933. This is open 24 hours a day. Summary  Take over-the-counter and prescription medicines only as told by your health care provider.  Return to your normal activities as told by your health care provider. Ask your health care provider what activities are safe for you.  Consider getting support for your loss. Sources of support include religious leaders, friends, family, professional counselors, and bereavement support groups.  Keep all follow-up visits as told by your health care provider. This is important. This information is not intended to replace advice given to you by your health care provider. Make sure you discuss any questions you have with your health care provider. Document Released: 03/26/2014 Document Revised: 08/23/2017 Document Reviewed: 02/18/2017 Elsevier Interactive Patient Education  2019 [employer]

## 2018-11-30 NOTE — Progress Notes (Signed)
Called the anesthesiologist to ask about discontinuing pt's epidural catheter. Platelet count is 72. Instructed to wait for one more CBC to d/c catheter.   Arva Chafe, RN

## 2018-11-30 NOTE — Discharge Summary (Addendum)
Postpartum Discharge Summary     Patient Name: Christina Zavala DOB: 24-Feb-1991 MRN: [id number]  Date of admission: 11/27/2018 Delivering Provider: Conan Bowens   Date of discharge: 12/01/2018  Admitting diagnosis: 38.6WKS WKS CTX Intrauterine pregnancy: [redacted]w[redacted]d     Secondary diagnosis:  Active Problems:   Iron deficiency anemia due to chronic blood loss   Previous cesarean delivery affecting pregnancy, antepartum   IUFD at 20 weeks or more of gestation   Disseminated intravascular coagulation due to placental abruption   Placental abruption   Pre-eclampsia   H/O: C-section  Additional problems: as above     Discharge diagnosis: Term Pregnancy Delivered, Preeclampsia (severe), PPH and DIC, fetal demise due to abruption                                                                                                Post partum procedures:blood transfusion  Augmentation: AROM and Pitocin  Complications: Placental Abruption, Hemorrhage>1084mL and [facility] course:  Induction of Labor With Vaginal Delivery    Christina Zavala is a 28 y.o. female G3P1011 with IUP at [redacted]w[redacted]d by --/29 presenting for abdominal pain and found to have intrauterine fetal demise. States wasn't feeling well today - had headache and blurry vision so laid down for a nap around noon. She woke up around 4pm with abdominal pain. Had trouble finding a ride to the hospital until tonight. States last felt fetal movement before nap.    Denies vaginal bleeding, leakage of fluids. Denies vomiting but reports some nausea. Currently no headache but states she just doesn't feel right. Still has spots in vision.   She received her prenatal care at [facility].  28 y.o. yo G3P2011 at [redacted]w[redacted]d was admitted to the hospital 11/27/2018 for induction of labor.  Indication for induction: Preeclampsia and fetal demise, abruption.  Patient had an uncomplicated labor course as follows: Membrane Rupture Time/Date: 3:57 PM  ,11/28/2018   Intrapartum Procedures: Episiotomy: None [1]                                         Lacerations:  Vaginal [6]  Patient had delivery of a Non Viable infant.  Information for the patient's newborn:  Christina, Zavala [[id number]]      11/28/2018 Patient: Christina Zavala MRN: [id number]  GBS status: negative  Patient is a 28 y.o. now G3P2011 s/p VBAC at [redacted]w[redacted]d, who was admitted for IOL for IUFD, severe preE, DIC. AROM 3h 81m prior to delivery with clear fluid.    Delivery Note At 7:03 PM a non-viable female was delivered via VBAC, Spontaneous (Presentation: cephalic; OA).  APGAR: 0, 0; weight: pending.   Placenta status: intact, 3-vessel cord, to pathology.  Cord:  with the following complications: none.    Head delivered OA. No nuchal cord present. Shoulder and body delivered in usual fashion. Infant delivered deceased, and taken to the warmer. Cord clamped x 2 and cut by family member. Placenta delivered spontaneously with gentle cord traction.  Bimanual  massage initiated immediately after delivery of the placenta. Cytotec 1000mg  administered per rectum. Blood products administered included 1 unit PRBCs, 1 unit cryoprecipitate, 1 unit platelets, 1 unit fresh frozen plasma, with 2 units PRBCs to follow. Additional uterotonics include hemabate IM x 1 and pitocin 20mg  IM x 1.  She also received phenylephrine x 2 and albumin for blood pressure support. A bakari baloon was placed and filled with of fluid and 2 18x18 laps were placed. Perineum inspected and found to have bilateral vaginal wall lacerations, which were repaired with 2.0-vicryl, however continued to have slow bleeding, and were subsequently packed with an 18x18 vaginal lap for a total of 3 laps with the patient.    Anesthesia:  epidural Episiotomy: None Lacerations: Vaginal Suture Repair: 2.0 vicryl Est. Blood Loss (mL): 2500 Augmentation: AROM, oxytocin, foley  Mom to AICU.  Baby to  [place].  [name] Ob Fellow 11/28/2018, 8:41 PM Patient is 28 y.o. S5K8127 PPD#1 s/p SVD at [redacted]w[redacted]d of IUFD, c/b pre-eclampsia with severe features, placental abruption, DIC, AKI. Has received multiple uterotonics and has bakri balloon was placed. S/p transfusion of 7 units pRBCs, 4 pool cryo, 2 units FFP, 2 unit PLT. Received magnesium x 24 hours and given IV and po meds for BP control. At discharge BPs were in the 120-150/70-96 range on Procardia and Vasotec.   Patient is discharged home 12/01/18.  Magnesium Sulfate recieved: Yes BMZ received: No  Physical exam  Vitals:   11/30/18 2336 12/01/18 0621 12/01/18 0806 12/01/18 1006  BP: 129/86 132/81 117/80 133/79  Pulse: 100 81 84 75  Resp: 20 20 18 18   Temp: 99.9 F (37.7 C) 98.7 F (37.1 C) 98.6 F (37 C)   TempSrc: Oral Oral Oral   SpO2: 98% 96% 99%   Weight:      Height:       General: alert, cooperative and no distress Lochia: appropriate Uterine Fundus: firm Incision: N/A DVT Evaluation: No evidence of DVT seen on physical exam. Labs: Lab Results  Component Value Date   WBC 14.0 (H) 11/30/2018   HGB 9.3 (L) 11/30/2018   HCT 26.7 (L) 11/30/2018   MCV 87.3 11/30/2018   PLT 72 (L) 11/30/2018   CMP Latest Ref Rng & Units 11/29/2018  Glucose 70 - 99 mg/dL 87  BUN 6 - 20 mg/dL 8  Creatinine 5.17 - 0.01 mg/dL 7.49  Sodium 449 - 675 mmol/L 134(L)  Potassium 3.5 - 5.1 mmol/L 3.6  Chloride 98 - 111 mmol/L 107  CO2 22 - 32 mmol/L 22  Calcium 8.9 - 10.3 mg/dL 7.3(L)  Total Protein 6.5 - 8.1 g/dL 4.8(L)  Total Bilirubin 0.3 - 1.2 mg/dL 0.8  Alkaline Phos 38 - 126 U/L 181(H)  AST 15 - 41 U/L 28  ALT 0 - 44 U/L 12    Discharge instruction: per After Visit Summary and "Baby and Me Booklet".  After visit meds:  Allergies as of 12/01/2018   No Known Allergies     Medication List    TAKE these medications   enalapril 10 MG tablet Commonly known as:  VASOTEC Take 1 tablet (10 mg total) by mouth daily.    ibuprofen 600 MG tablet Commonly known as:  ADVIL,MOTRIN Take 1 tablet (600 mg total) by mouth every 6 (six) hours as needed.   NIFEdipine 30 MG 24 hr tablet Commonly known as:  ADALAT CC Take 1 tablet (30 mg total) by mouth 2 (two) times daily.  Diet: routine diet  Activity: Advance as tolerated. Pelvic rest for 6 weeks.   Outpatient follow up:1 week Follow up Appt:No future appointments. Follow up Visit: Follow-up Information    [facility] [place] Follow up in 1 week(s).   Specialty:  Obstetrics and Gynecology Why:  BP Contact information: [address] [place] [phone]           Please schedule this patient for Postpartum visit in: 1 week with the following provider: MD For C/S patients schedule nurse incision check in weeks 2 weeks: no High risk pregnancy complicated by: previous cesarean section Delivery mode:  SVD Anticipated Birth Control:  other/unsure PP Procedures needed: BP check  Schedule Integrated BH visit: no      Newborn Data: Live born female  Birth Weight: 6 lb 8.2 oz (2955 g) APGAR: ,   Newborn Delivery   Birth date/time:  11/28/2018 19:03:00 Delivery type:  VBAC, Spontaneous     Baby Feeding: n/a Disposition:morgue   11/30/2018 Scheryl DarterJames Arnold, MD

## 2018-11-30 NOTE — Lactation Note (Signed)
Lactation Consultation Note  Patient Name: Christina Zavala EGBTD'V Date: 11/30/2018    A head of cabbage was provided. The leaves were rinsed & dried with the large vein removed & discarded & the smaller veins crushed and applied to the patient's breasts. The remaining head of cabbage was put on ice. Patient was informed to replace leaves as they wilt.   Patient has the lactation phone # to call if she has questions about breast management. She knows to call her MD if she experiences signs of mastitis.    Lurline Hare [facility] 11/30/2018, 9:16 AM

## 2018-11-30 NOTE — Progress Notes (Signed)
Called by RN and notified of patient platelet count of 82k. Order given that it is ok to remove epidural catheter. Spoke to patient in room 312 regarding signs and symptoms of epidural hematoma.

## 2018-12-01 LAB — CBC
HCT: 32.1 % — ABNORMAL LOW (ref 36.0–46.0)
HEMOGLOBIN: 11 g/dL — AB (ref 12.0–15.0)
MCH: 30.2 pg (ref 26.0–34.0)
MCHC: 34.3 g/dL (ref 30.0–36.0)
MCV: 88.2 fL (ref 80.0–100.0)
Platelets: 129 10*3/uL — ABNORMAL LOW (ref 150–400)
RBC: 3.64 MIL/uL — ABNORMAL LOW (ref 3.87–5.11)
RDW: 14.3 % (ref 11.5–15.5)
WBC: 14.6 10*3/uL — ABNORMAL HIGH (ref 4.0–10.5)
nRBC: 0.8 % — ABNORMAL HIGH (ref 0.0–0.2)

## 2018-12-01 NOTE — Addendum Note (Signed)
Addendum  created 12/01/18 0829 by Shelton Silvas, MD   Order list changed

## 2018-12-01 NOTE — Plan of Care (Signed)
Discharge instructions given to patient, PEC s/s reviewed and patient verbalizes understanding. BP medications prescribed reviewed and patient states that she understands the importance of BP management with meds. Follow-up scheduled. Patient has no questions.

## 2018-12-01 NOTE — Addendum Note (Signed)
Addendum  created 12/01/18 0727 by Shelton SilvasHollis, Soloman Mckeithan D, MD   Order list changed

## 2018-12-02 LAB — TYPE AND SCREEN
ABO/RH(D): B POS
Antibody Screen: NEGATIVE
UNIT DIVISION: 0
Unit division: 0
Unit division: 0
Unit division: 0
Unit division: 0
Unit division: 0
Unit division: 0
Unit division: 0

## 2018-12-02 LAB — BPAM RBC
BLOOD PRODUCT EXPIRATION DATE: 202001162359
BLOOD PRODUCT EXPIRATION DATE: 202001242359
BLOOD PRODUCT EXPIRATION DATE: 202001242359
Blood Product Expiration Date: 202001162359
Blood Product Expiration Date: 202001242359
Blood Product Expiration Date: 202001242359
Blood Product Expiration Date: 202001242359
Blood Product Expiration Date: 202002092359
ISSUE DATE / TIME: 202001060921
ISSUE DATE / TIME: 202001061114
ISSUE DATE / TIME: 202001061908
ISSUE DATE / TIME: 202001062033
ISSUE DATE / TIME: 202001062033
ISSUE DATE / TIME: 202001070156
ISSUE DATE / TIME: 202001070156
Unit Type and Rh: 5100
Unit Type and Rh: 5100
Unit Type and Rh: 5100
Unit Type and Rh: 5100
Unit Type and Rh: 5100
Unit Type and Rh: 5100
Unit Type and Rh: 5100
Unit Type and Rh: 5100

## 2019-08-12 IMAGING — US US MFM OB COMP +14 WKS
1 series · 14 of 28 positions shown · non-contrast
Comparison: none

[Series 1: us mfm ob comp +14 wks · 14 of 110 slices shown]
[im 5/110]
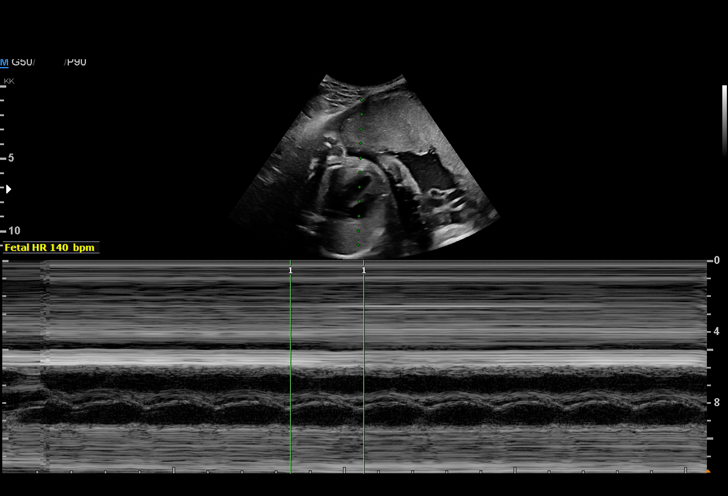
[im 13/110]
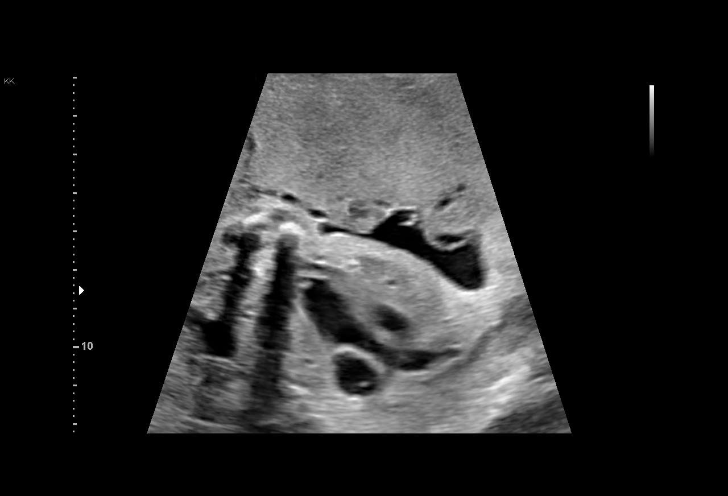
[im 21/110]
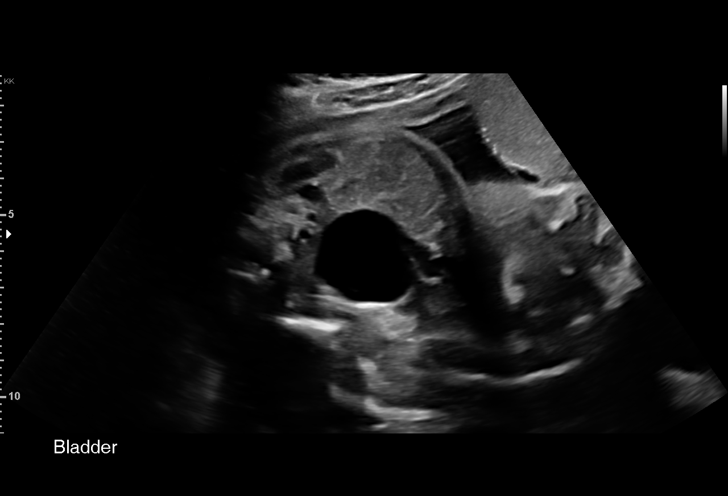
[im 29/110]
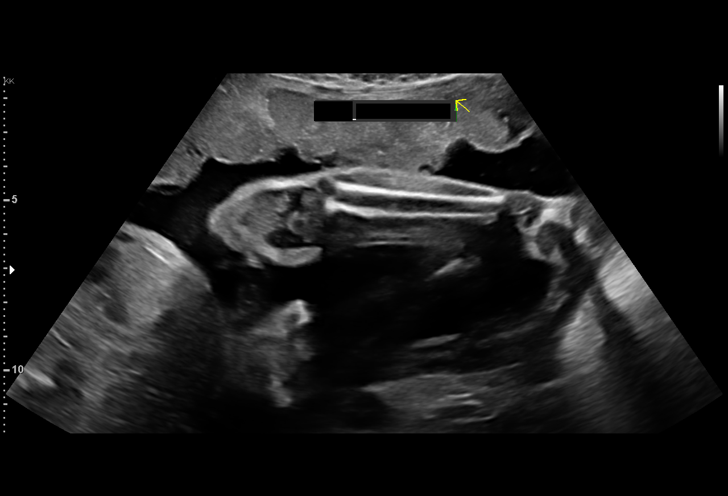
[im 37/110]
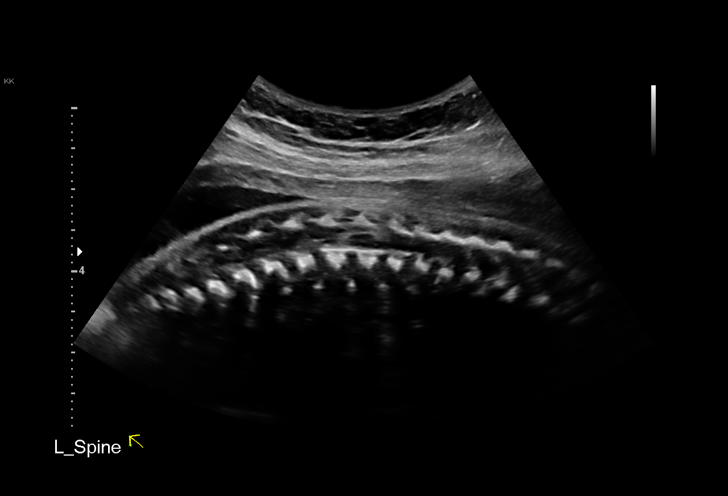
[im 45/110]
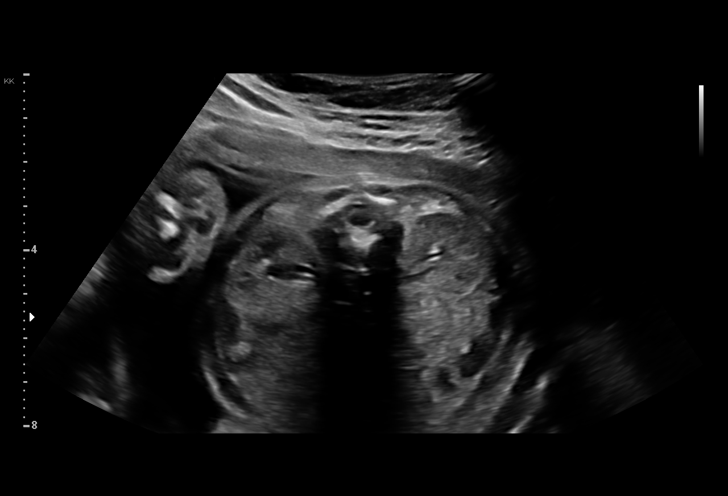
[im 53/110]
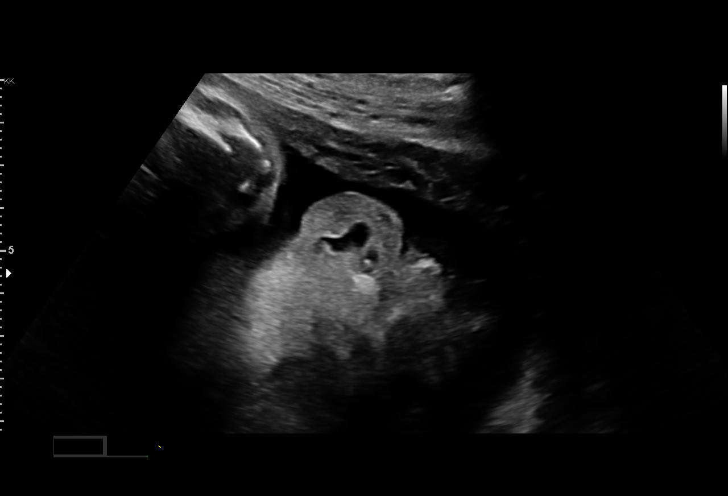
[im 61/110]
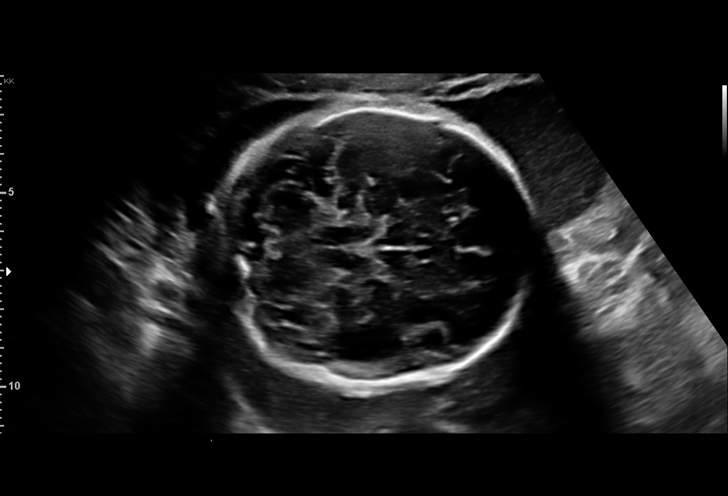
[im 69/110]
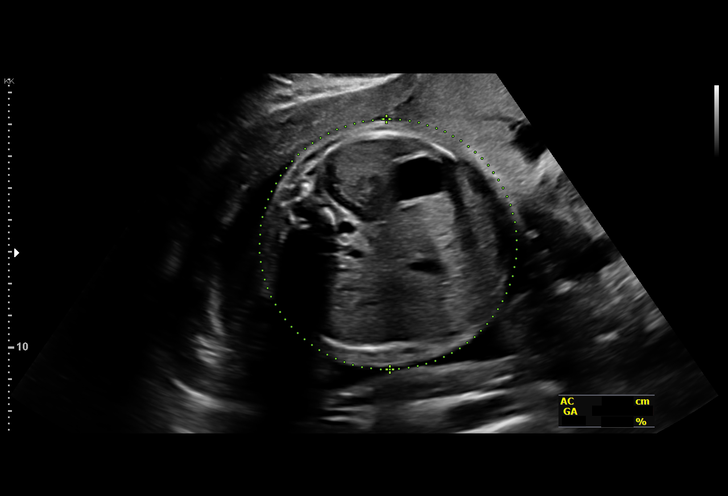
[im 77/110]
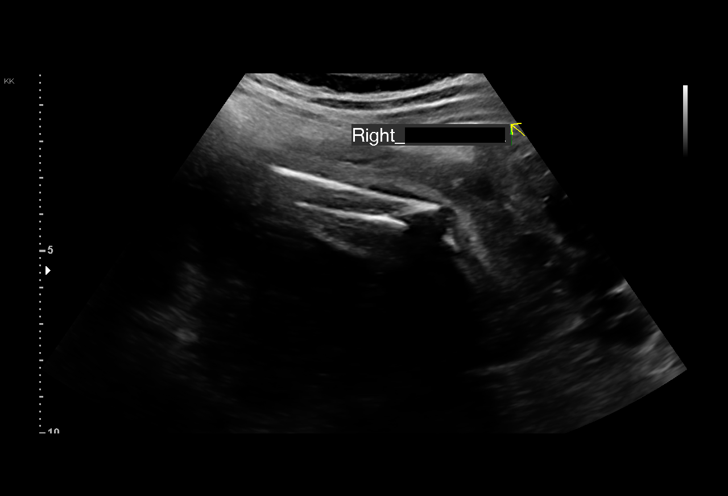
[im 85/110]
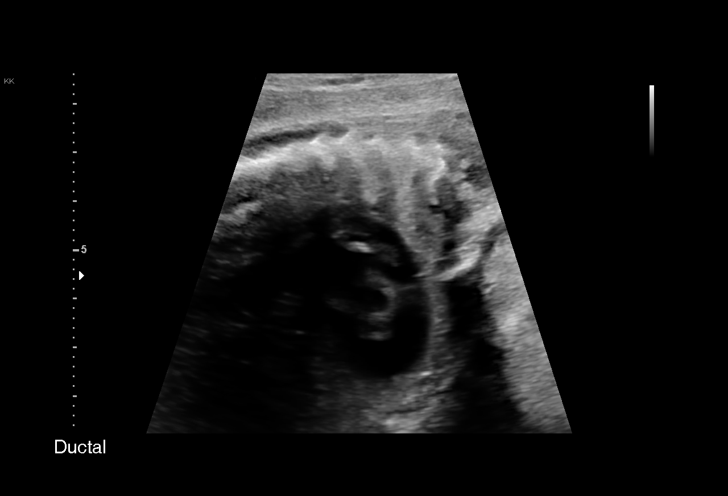
[im 93/110]
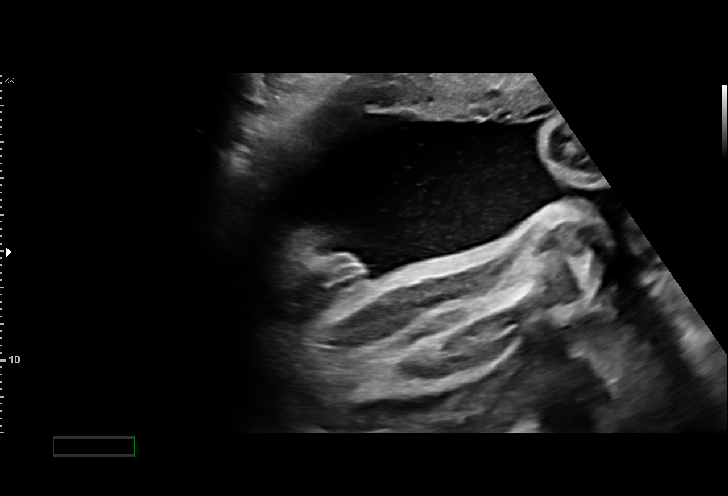
[im 101/110]
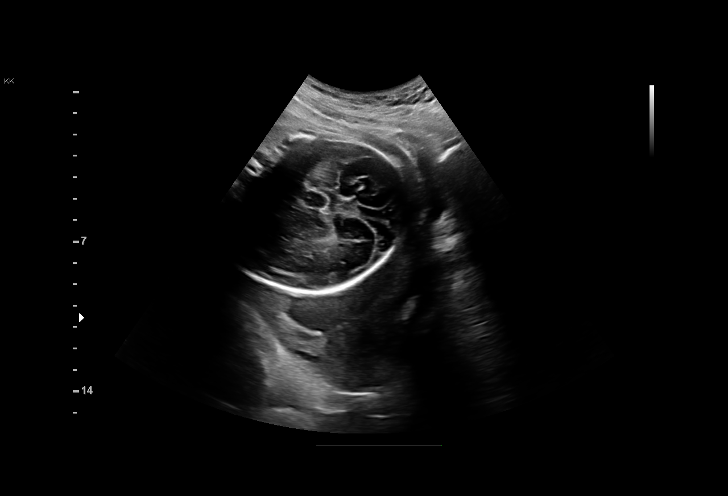
[im 110/110]
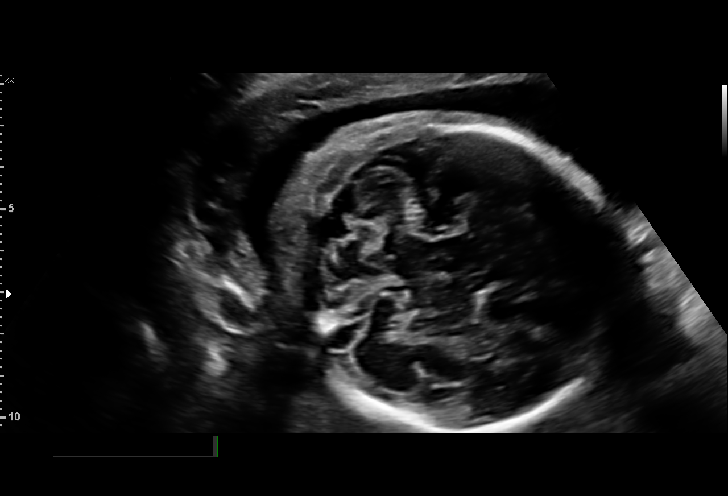

[14 of 28 positions shown; findings below may reference images not displayed]

Indications

Encounter for antenatal screening for
malformations
Previous cesarean delivery, antepartum
29 weeks gestation of pregnancy
Vital Signs

BMI:
Fetal Evaluation

Num Of Fetuses:          1
Fetal Heart Rate(bpm):   140
Cardiac Activity:        Observed
Presentation:            Cephalic
Placenta:                Anterior
P. Cord Insertion:       Visualized

Amniotic Fluid
AFI FV:      Within normal limits

AFI Sum(cm)     %Tile       Largest Pocket(cm)
17.24           64

RUQ(cm)       RLQ(cm)       LUQ(cm)        LLQ(cm)
4.58
Biometry
BPD:      71.5  mm     G. Age:  28w 5d         28  %    CI:        75.37   %    70 - 86
FL/HC:       21.8  %    19.6 -
HC:      261.2  mm     G. Age:  28w 3d          8  %    HC/AC:       1.05       0.99 -
AC:      249.7  mm     G. Age:  29w 1d         49  %    FL/BPD:      79.7  %    71 - 87
FL:         57  mm     G. Age:  29w 6d         61  %    FL/AC:       22.8  %    20 - 24

Est. FW:    9020   gm          3 lb     57  %
OB History

Gravidity:    3         Term:   1         SAB:   1
Living:       1
Gestational Age

LMP:           31w 4d        Date:  02/10/18                 EDD:   11/17/18
U/S Today:     29w 0d                                        EDD:   12/05/18
Best:          29w 0d     Det. By:  Early Ultrasound         EDD:   12/05/18
(04/15/18)
Anatomy

Cranium:               Appears normal         Aortic Arch:            Appears normal
Cavum:                 Appears normal         Ductal Arch:            Not well visualized
Ventricles:            Appears normal         Diaphragm:              Appears normal
Choroid Plexus:        Appears normal         Stomach:                Appears normal, left
sided
Cerebellum:            Appears normal         Abdomen:                Appears normal
Posterior Fossa:       Appears normal         Abdominal Wall:         Not well visualized
Nuchal Fold:           Not applicable (>20    Cord Vessels:           Appears normal (3
wks GA)                                        vessel cord)
Face:                  Not well visualized    Kidneys:                Appear normal
Lips:                  Appears normal         Bladder:                Appears normal
Thoracic:              Appears normal         Spine:                  Appears normal
Heart:                 Appears normal         Upper Extremities:      Appears normal
(4CH, axis, and situs
RVOT:                  Appears normal         Lower Extremities:      Appears normal
LVOT:                  Appears normal

Other:  Female gender. Right Heel visualized. Technically difficult due to
advanced GA and fetal position.
Cervix Uterus Adnexa

Cervix
Not visualized (advanced GA >67wks)
Impression

Normal interval growth.  No ultrasonic evidence of structural
fetal anomalies.
Suboptimal views of the fetal anatomy was obtained today
Recommendations

Follow up in 4 weeks to complete the anatomy.

## 2020-12-19 ENCOUNTER — Other Ambulatory Visit: Payer: Self-pay

## 2020-12-19 ENCOUNTER — Emergency Department (HOSPITAL_BASED_OUTPATIENT_CLINIC_OR_DEPARTMENT_OTHER)
Admission: EM | Admit: 2020-12-19 | Discharge: 2020-12-19 | Disposition: A | Discharge status: Home / Self Care | Payer: Medicaid Other | Attending: Emergency Medicine | Admitting: Emergency Medicine

## 2020-12-19 ENCOUNTER — Encounter (HOSPITAL_BASED_OUTPATIENT_CLINIC_OR_DEPARTMENT_OTHER): Payer: Self-pay | Admitting: *Deleted

## 2020-12-19 DIAGNOSIS — R112 Nausea with vomiting, unspecified: Secondary | ICD-10-CM | POA: Insufficient documentation

## 2020-12-19 DIAGNOSIS — R1030 Lower abdominal pain, unspecified: Secondary | ICD-10-CM | POA: Insufficient documentation

## 2020-12-19 DIAGNOSIS — Z5321 Procedure and treatment not carried out due to patient leaving prior to being seen by health care provider: Secondary | ICD-10-CM | POA: Diagnosis not present

## 2020-12-19 DIAGNOSIS — M545 Low back pain, unspecified: Secondary | ICD-10-CM | POA: Insufficient documentation

## 2020-12-19 LAB — CBC WITH DIFFERENTIAL/PLATELET
Abs Immature Granulocytes: 0.02 10*3/uL (ref 0.00–0.07)
Basophils Absolute: 0 10*3/uL (ref 0.0–0.1)
Basophils Relative: 1 %
Eosinophils Absolute: 0.2 10*3/uL (ref 0.0–0.5)
Eosinophils Relative: 2 %
HCT: 40.2 % (ref 36.0–46.0)
Hemoglobin: 13.5 g/dL (ref 12.0–15.0)
Immature Granulocytes: 0 %
Lymphocytes Relative: 29 %
Lymphs Abs: 2.5 10*3/uL (ref 0.7–4.0)
MCH: 32.3 pg (ref 26.0–34.0)
MCHC: 33.6 g/dL (ref 30.0–36.0)
MCV: 96.2 fL (ref 80.0–100.0)
Monocytes Absolute: 0.5 10*3/uL (ref 0.1–1.0)
Monocytes Relative: 5 %
Neutro Abs: 5.5 10*3/uL (ref 1.7–7.7)
Neutrophils Relative %: 63 %
Platelets: 246 10*3/uL (ref 150–400)
RBC: 4.18 MIL/uL (ref 3.87–5.11)
RDW: 12.6 % (ref 11.5–15.5)
WBC: 8.7 10*3/uL (ref 4.0–10.5)
nRBC: 0 % (ref 0.0–0.2)

## 2020-12-19 LAB — PREGNANCY, URINE: Preg Test, Ur: NEGATIVE

## 2020-12-19 LAB — URINALYSIS, ROUTINE W REFLEX MICROSCOPIC
Bilirubin Urine: NEGATIVE
Glucose, UA: NEGATIVE mg/dL
Hgb urine dipstick: NEGATIVE
Ketones, ur: NEGATIVE mg/dL
Nitrite: NEGATIVE
Protein, ur: NEGATIVE mg/dL
Specific Gravity, Urine: 1.02 (ref 1.005–1.030)
pH: 6 (ref 5.0–8.0)

## 2020-12-19 LAB — COMPREHENSIVE METABOLIC PANEL
ALT: 15 U/L (ref 0–44)
AST: 19 U/L (ref 15–41)
Albumin: 4.6 g/dL (ref 3.5–5.0)
Alkaline Phosphatase: 45 U/L (ref 38–126)
Anion gap: 7 (ref 5–15)
BUN: 8 mg/dL (ref 6–20)
CO2: 27 mmol/L (ref 22–32)
Calcium: 9.4 mg/dL (ref 8.9–10.3)
Chloride: 102 mmol/L (ref 98–111)
Creatinine, Ser: 0.77 mg/dL (ref 0.44–1.00)
GFR, Estimated: >60 mL/min (ref >60)
Glucose, Bld: 60 mg/dL — ABNORMAL LOW (ref 70–99)
Potassium: 3.5 mmol/L (ref 3.5–5.1)
Sodium: 136 mmol/L (ref 135–145)
Total Bilirubin: 0.4 mg/dL (ref 0.3–1.2)
Total Protein: 7.7 g/dL (ref 6.5–8.1)

## 2020-12-19 LAB — URINALYSIS, MICROSCOPIC (REFLEX)

## 2020-12-19 LAB — LIPASE, BLOOD: Lipase: 37 U/L (ref 11–51)

## 2020-12-19 NOTE — ED Triage Notes (Addendum)
C/o  Lower abd pain/ lower back pain   With n/v x 1 week , LMP 12/15

## 2021-08-04 ENCOUNTER — Emergency Department (HOSPITAL_BASED_OUTPATIENT_CLINIC_OR_DEPARTMENT_OTHER)
Admission: EM | Admit: 2021-08-04 | Discharge: 2021-08-04 | Discharge status: Against Medical Advice | Payer: Medicaid Other

## 2021-08-04 ENCOUNTER — Other Ambulatory Visit: Payer: Self-pay

## 2021-11-05 ENCOUNTER — Other Ambulatory Visit: Payer: Self-pay

## 2021-11-05 ENCOUNTER — Emergency Department (HOSPITAL_BASED_OUTPATIENT_CLINIC_OR_DEPARTMENT_OTHER)
Admission: EM | Admit: 2021-11-05 | Discharge: 2021-11-05 | Disposition: A | Discharge status: Home / Self Care | Payer: Medicaid Other | Attending: Emergency Medicine | Admitting: Emergency Medicine

## 2021-11-05 ENCOUNTER — Encounter (HOSPITAL_BASED_OUTPATIENT_CLINIC_OR_DEPARTMENT_OTHER): Payer: Self-pay

## 2021-11-05 DIAGNOSIS — R42 Dizziness and giddiness: Secondary | ICD-10-CM | POA: Diagnosis not present

## 2021-11-05 DIAGNOSIS — Z87891 Personal history of nicotine dependence: Secondary | ICD-10-CM | POA: Insufficient documentation

## 2021-11-05 LAB — CBC WITH DIFFERENTIAL/PLATELET
Abs Immature Granulocytes: 0.02 10*3/uL (ref 0.00–0.07)
Basophils Absolute: 0 10*3/uL (ref 0.0–0.1)
Basophils Relative: 0 %
Eosinophils Absolute: 0.1 10*3/uL (ref 0.0–0.5)
Eosinophils Relative: 1 %
HCT: 38.8 % (ref 36.0–46.0)
Hemoglobin: 13.3 g/dL (ref 12.0–15.0)
Immature Granulocytes: 0 %
Lymphocytes Relative: 28 %
Lymphs Abs: 2.6 10*3/uL (ref 0.7–4.0)
MCH: 32 pg (ref 26.0–34.0)
MCHC: 34.3 g/dL (ref 30.0–36.0)
MCV: 93.3 fL (ref 80.0–100.0)
Monocytes Absolute: 0.5 10*3/uL (ref 0.1–1.0)
Monocytes Relative: 6 %
Neutro Abs: 6 10*3/uL (ref 1.7–7.7)
Neutrophils Relative %: 65 %
Platelets: 234 10*3/uL (ref 150–400)
RBC: 4.16 MIL/uL (ref 3.87–5.11)
RDW: 13.2 % (ref 11.5–15.5)
WBC: 9.2 10*3/uL (ref 4.0–10.5)
nRBC: 0 % (ref 0.0–0.2)

## 2021-11-05 LAB — COMPREHENSIVE METABOLIC PANEL
ALT: 24 U/L (ref 0–44)
AST: 24 U/L (ref 15–41)
Albumin: 4.4 g/dL (ref 3.5–5.0)
Alkaline Phosphatase: 48 U/L (ref 38–126)
Anion gap: 7 (ref 5–15)
BUN: 8 mg/dL (ref 6–20)
CO2: 24 mmol/L (ref 22–32)
Calcium: 9.3 mg/dL (ref 8.9–10.3)
Chloride: 104 mmol/L (ref 98–111)
Creatinine, Ser: 0.88 mg/dL (ref 0.44–1.00)
GFR, Estimated: >60 mL/min (ref >60)
Glucose, Bld: 101 mg/dL — ABNORMAL HIGH (ref 70–99)
Potassium: 3.6 mmol/L (ref 3.5–5.1)
Sodium: 135 mmol/L (ref 135–145)
Total Bilirubin: 0.6 mg/dL (ref 0.3–1.2)
Total Protein: 7.4 g/dL (ref 6.5–8.1)

## 2021-11-05 LAB — URINALYSIS, ROUTINE W REFLEX MICROSCOPIC
Bilirubin Urine: NEGATIVE
Glucose, UA: NEGATIVE mg/dL
Ketones, ur: NEGATIVE mg/dL
Nitrite: NEGATIVE
Protein, ur: NEGATIVE mg/dL
Specific Gravity, Urine: 1.025 (ref 1.005–1.030)
pH: 6.5 (ref 5.0–8.0)

## 2021-11-05 LAB — URINALYSIS, MICROSCOPIC (REFLEX)

## 2021-11-05 LAB — LIPASE, BLOOD: Lipase: 39 U/L (ref 11–51)

## 2021-11-05 LAB — PREGNANCY, URINE: Preg Test, Ur: NEGATIVE

## 2021-11-05 MED ORDER — ONDANSETRON HCL 4 MG/2ML IJ SOLN
4.0000 mg | Freq: Once | INTRAMUSCULAR | Status: AC
  Administered 2021-11-05: 19:00:00 4 mg via INTRAVENOUS
  Filled 2021-11-05: qty 2

## 2021-11-05 MED ORDER — SODIUM CHLORIDE 0.9 % IV BOLUS
1000.0000 mL | Freq: Once | INTRAVENOUS | Status: AC
  Administered 2021-11-05: 19:00:00 1000 mL via INTRAVENOUS

## 2021-11-05 MED ORDER — METOCLOPRAMIDE HCL 5 MG/ML IJ SOLN: 10.0000 mg | Freq: Once | INTRAMUSCULAR | Status: DC

## 2021-11-05 MED ORDER — ONDANSETRON 4 MG PO TBDP: ORAL_TABLET | ORAL | 0 refills | Status: DC

## 2021-11-05 MED ORDER — DIPHENHYDRAMINE HCL 50 MG/ML IJ SOLN: 25.0000 mg | Freq: Once | INTRAMUSCULAR | Status: DC

## 2021-11-05 NOTE — Discharge Instructions (Addendum)
Eat and drink as well as you can for the next 48 hours.  Please follow-up with your family doctor in the office.  Please return for worsening symptoms.  You can take the Zofran for nausea.  Take Imodium for diarrhea.

## 2021-11-05 NOTE — ED Triage Notes (Addendum)
Pt c/o dizziness & vomiting intermittently x 1 week. Unsure if pregnant. States period last month was only 3 days, hasnt gotten period yet this month. Also stopped taking her antihypertensives in 2020. Hypertensive during triage.

## 2021-11-05 NOTE — ED Provider Notes (Signed)
[facility] EMERGENCY DEPARTMENT Provider Note   CSN: [id number] Arrival date & time: 11/05/21  1816     History Chief Complaint  Patient presents with   Dizziness    Christina Zavala is a 30 y.o. female.  30 yo F with a chief complaints of dizziness and vomiting going on for the past couple weeks.  She tells me that she gets lightheaded sometimes after eating and sometimes if she stands up suddenly.  Feels like she is got a pass out.  Has some lower abdominal discomfort off and on with this.  Having diarrhea as well.  Denies any significant vaginal bleeding.  Denies urinary symptoms.  Denies head injury denies loss of consciousness.  Denies one-sided numbness or weakness denies difficulty speech swallowing.  History of hypertension with a pregnancy.  Not on any antihypertensives or any other medications currently.  The history is provided by the patient.  Dizziness Quality:  Lightheadedness Severity:  Moderate Onset quality:  Gradual Duration:  1 week Timing:  Constant Progression:  Worsening Chronicity:  New Context: standing up   Relieved by:  Nothing Worsened by:  Nothing Associated symptoms: no chest pain, no headaches, no nausea, no palpitations, no shortness of breath and no vomiting       Past Medical History:  Diagnosis Date   Achilles rupture, left     Patient Active Problem List   Diagnosis Date Noted   Disseminated intravascular coagulation due to placental abruption 11/29/2018   Placental abruption 11/29/2018   Pre-eclampsia 11/29/2018   H/O: C-section 11/29/2018   IUFD at 20 weeks or more of gestation 11/28/2018   Chlamydia infection affecting pregnancy 04/20/2018   Supervision of other normal pregnancy, antepartum 04/15/2018   Previous cesarean delivery affecting pregnancy, antepartum 04/15/2018   Achilles tendon tear, left, initial encounter 03/15/2018   Iron deficiency anemia due to chronic blood loss 04/24/2017   SAB (spontaneous  abortion) 04/24/2017    Past Surgical History:  Procedure Laterality Date   CESAREAN SECTION       OB History     Gravida  3   Para  2   Term  2   Preterm      AB  1   Living  1      SAB  1   IAB      Ectopic      Multiple  0   Live Births  1           History reviewed. No pertinent family history.  Social History   Tobacco Use   Smoking status: Former    Packs/day: 0.00    Types: Cigarettes    Quit date: 2019    Years since quitting: 3.9   Smokeless tobacco: Never  Vaping Use   Vaping Use: Never used  Substance Use Topics   Alcohol use: Never   Drug use: No    Home Medications Prior to Admission medications   Medication Sig Start Date End Date Taking? Authorizing Provider  ondansetron (ZOFRAN-ODT) 4 MG disintegrating tablet 4mg  ODT q4 hours prn nausea/vomit 11/05/21  Yes 11/07/21, DO  enalapril (VASOTEC) 10 MG tablet Take 1 tablet (10 mg total) by mouth daily. 12/01/18   01/30/19, MD  ibuprofen (ADVIL,MOTRIN) 600 MG tablet Take 1 tablet (600 mg total) by mouth every 6 (six) hours as needed. 11/30/18   01/29/19, MD  NIFEdipine (ADALAT CC) 30 MG 24 hr tablet Take 1 tablet (30 mg total) by mouth 2 (  two) times daily. 11/30/18   Woodroe Mode, MD    Allergies    Patient has no known allergies.  Review of Systems   Review of Systems  Constitutional:  Negative for chills and fever.  HENT:  Negative for congestion and rhinorrhea.   Eyes:  Negative for redness and visual disturbance.  Respiratory:  Negative for shortness of breath and wheezing.   Cardiovascular:  Negative for chest pain and palpitations.  Gastrointestinal:  Negative for nausea and vomiting.  Genitourinary:  Negative for dysuria and urgency.  Musculoskeletal:  Negative for arthralgias and myalgias.  Skin:  Negative for pallor and wound.  Neurological:  Positive for dizziness. Negative for headaches.   Physical Exam Updated Vital Signs BP 136/87    Pulse (!) 48     Temp 97.6 F (36.4 C) (Oral)    Resp 14    Ht 5\' 3"  (1.6 m)    Wt 68 kg    LMP 10/06/2021 (Approximate)    SpO2 100%    BMI 26.57 kg/m   Physical Exam Vitals and nursing note reviewed.  Constitutional:      General: She is not in acute distress.    Appearance: She is well-developed. She is not diaphoretic.  HENT:     Head: Normocephalic and atraumatic.  Eyes:     Pupils: Pupils are equal, round, and reactive to light.  Cardiovascular:     Rate and Rhythm: Normal rate and regular rhythm.     Heart sounds: No murmur heard.   No friction rub. No gallop.  Pulmonary:     Effort: Pulmonary effort is normal.     Breath sounds: No wheezing or rales.  Abdominal:     General: There is no distension.     Palpations: Abdomen is soft.     Tenderness: There is no abdominal tenderness.  Musculoskeletal:        General: No tenderness.     Cervical back: Normal range of motion and neck supple.  Skin:    General: Skin is warm and dry.  Neurological:     Mental Status: She is alert and oriented to person, place, and time.     Cranial Nerves: Cranial nerves 2-12 are intact.     Sensory: Sensation is intact.     Motor: Motor function is intact.     Coordination: Coordination is intact.     Comments: Benign neuro exam  Psychiatric:        Behavior: Behavior normal.    ED Results / Procedures / Treatments   Labs (all labs ordered are listed, but only abnormal results are displayed) Labs Reviewed  COMPREHENSIVE METABOLIC PANEL - Abnormal; Notable for the following components:      Result Value   Glucose, Bld 101 (*)    All other components within normal limits  URINALYSIS, ROUTINE W REFLEX MICROSCOPIC - Abnormal; Notable for the following components:   Hgb urine dipstick TRACE (*)    Leukocytes,Ua MODERATE (*)    All other components within normal limits  URINALYSIS, MICROSCOPIC (REFLEX) - Abnormal; Notable for the following components:   Bacteria, UA RARE (*)    All other components  within normal limits  CBC WITH DIFFERENTIAL/PLATELET  LIPASE, BLOOD  PREGNANCY, URINE    EKG EKG Interpretation  Date/Time:  Wednesday November 05 2021 18:26:32 EST Ventricular Rate:  63 PR Interval:  158 QRS Duration: 79 QT Interval:  390 QTC Calculation: 400 R Axis:   -38 Text Interpretation: Sinus arrhythmia  Left axis deviation no wpw, prolonged qt or brugada No old tracing to compare Confirmed by Deno Etienne [phone]) on 11/05/2021 6:33:49 PM  Radiology No results found.  Procedures Procedures   Medications Ordered in ED Medications  sodium chloride 0.9 % bolus 1,000 mL (1,000 mLs Intravenous New Bag/Given 11/05/21 1849)  ondansetron (ZOFRAN) injection 4 mg (4 mg Intravenous Given 11/05/21 1849)    ED Course  I have reviewed the triage vital signs and the nursing notes.  Pertinent labs & imaging results that were available during my care of the patient were reviewed by me and considered in my medical decision making (see chart for details).    MDM Rules/Calculators/A&P                           30 yo F who was feeling dizzy concurrent with the illness causing nausea vomiting and diarrhea.  Going on for about a week.  We will give a bolus of IV fluids  anti emetics work reassess.  Patient was improved after some IV fluids.  Requesting discharge home.  7:36 PM:  I have discussed the diagnosis/risks/treatment options with the patient and believe the pt to be eligible for discharge home to follow-up with PCP. We also discussed returning to the ED immediately if new or worsening sx occur. We discussed the sx which are most concerning (e.g., sudden worsening pain, fever, inability to tolerate by mouth) that necessitate immediate return. Medications administered to the patient during their visit and any new prescriptions provided to the patient are listed below.  Medications given during this visit Medications  sodium chloride 0.9 % bolus 1,000 mL (1,000 mLs Intravenous New  Bag/Given 11/05/21 1849)  ondansetron (ZOFRAN) injection 4 mg (4 mg Intravenous Given 11/05/21 1849)     The patient appears reasonably screen and/or stabilized for discharge and I doubt any other medical condition or other [facility] requiring further screening, evaluation, or treatment in the ED at this time prior to discharge.    Final Clinical Impression(s) / ED Diagnoses Final diagnoses:  Dizziness    Rx / DC Orders ED Discharge Orders          Ordered    ondansetron (ZOFRAN-ODT) 4 MG disintegrating tablet        11/05/21 1934             Deno Etienne, DO 11/05/21 1936

## 2022-04-20 ENCOUNTER — Encounter (HOSPITAL_BASED_OUTPATIENT_CLINIC_OR_DEPARTMENT_OTHER): Payer: Self-pay | Admitting: Emergency Medicine

## 2022-04-20 ENCOUNTER — Emergency Department (HOSPITAL_BASED_OUTPATIENT_CLINIC_OR_DEPARTMENT_OTHER)
Admission: EM | Admit: 2022-04-20 | Discharge: 2022-04-20 | Discharge status: Against Medical Advice | Payer: Medicaid Other | Attending: Emergency Medicine | Admitting: Emergency Medicine

## 2022-04-20 ENCOUNTER — Emergency Department (HOSPITAL_BASED_OUTPATIENT_CLINIC_OR_DEPARTMENT_OTHER)
Admission: EM | Admit: 2022-04-20 | Discharge: 2022-04-20 | Discharge status: Against Medical Advice | Payer: Medicaid Other | Source: Home / Self Care | Attending: Emergency Medicine | Admitting: Emergency Medicine

## 2022-04-20 DIAGNOSIS — K529 Noninfective gastroenteritis and colitis, unspecified: Secondary | ICD-10-CM | POA: Insufficient documentation

## 2022-04-20 DIAGNOSIS — R519 Headache, unspecified: Secondary | ICD-10-CM | POA: Insufficient documentation

## 2022-04-20 DIAGNOSIS — R112 Nausea with vomiting, unspecified: Secondary | ICD-10-CM | POA: Insufficient documentation

## 2022-04-20 DIAGNOSIS — Z5321 Procedure and treatment not carried out due to patient leaving prior to being seen by health care provider: Secondary | ICD-10-CM | POA: Insufficient documentation

## 2022-04-20 LAB — PREGNANCY, URINE: Preg Test, Ur: NEGATIVE

## 2022-04-20 MED ORDER — PROCHLORPERAZINE EDISYLATE 10 MG/2ML IJ SOLN
10.0000 mg | Freq: Once | INTRAMUSCULAR | Status: AC
Start: 2022-04-20 — End: 2022-04-20
  Administered 2022-04-20: 10 mg via INTRAMUSCULAR
  Filled 2022-04-20: qty 2

## 2022-04-20 MED ORDER — ONDANSETRON 4 MG PO TBDP
4.0000 mg | ORAL_TABLET | Freq: Three times a day (TID) | ORAL | 0 refills | Status: AC | PRN
Start: 2022-04-20 — End: ?

## 2022-04-20 MED ORDER — KETOROLAC TROMETHAMINE 15 MG/ML IJ SOLN
15.0000 mg | Freq: Once | INTRAMUSCULAR | Status: DC
  Filled 2022-04-20: qty 1

## 2022-04-20 MED ORDER — PROCHLORPERAZINE EDISYLATE 10 MG/2ML IJ SOLN
10.0000 mg | Freq: Once | INTRAMUSCULAR | Status: DC
  Filled 2022-04-20: qty 2

## 2022-04-20 MED ORDER — KETOROLAC TROMETHAMINE 15 MG/ML IJ SOLN
15.0000 mg | Freq: Once | INTRAMUSCULAR | Status: AC
  Administered 2022-04-20: 15 mg via INTRAMUSCULAR
  Filled 2022-04-20: qty 1

## 2022-04-20 MED ORDER — SODIUM CHLORIDE 0.9 % IV BOLUS: 1000.0000 mL | Freq: Once | INTRAVENOUS | Status: DC

## 2022-04-20 NOTE — ED Notes (Signed)
Pt left without dc information, states she has to pick up her child from daycare.
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
## 2022-04-20 NOTE — ED Triage Notes (Signed)
Patient reports n/v for the last few days, states she began having sharp intermittent pain to the right side of her head last night. 

## 2022-04-20 NOTE — Discharge Instructions (Signed)
Likely have a viral gastroenteritis.  I have sent Zofran into the pharmacy for you for symptom management.  Your headache improved following medications in the emergency room.  Your urine pregnancy test was negative.  Follow-up with your PCP.  If you have any worsening symptoms you can return to the emergency room.

## 2022-04-20 NOTE — ED Triage Notes (Signed)
Patient reports n/v for the last few days, states she began having sharp intermittent pain to the right side of her head last night.

## 2022-04-20 NOTE — ED Notes (Signed)
Patient states she has to go pick up her son's father from work and has to leave, states she will come back to be seen.

## 2022-04-20 NOTE — ED Provider Notes (Signed)
[facility] EMERGENCY DEPARTMENT Provider Note   CSN: [id number] Arrival date & time: 04/20/22  1356     History  Chief Complaint  Patient presents with   Headache    Christina Zavala is a 31 y.o. female.  31 year old female presents today for 1 week duration of nausea, vomiting, and diarrhea and since last night she has had right-sided headache.  Does endorse history of migraines but states this feels different.  She also endorses weight loss of about 1 month duration.  But this is different options at this headache.  Denies night sweats, or fever.  She is otherwise well-appearing, tolerating p.o. intake without difficulty.  She would like to have a pregnancy test done.  The history is provided by the patient. No language interpreter was used.      Home Medications Prior to Admission medications   Medication Sig Start Date End Date Taking? Authorizing Provider  enalapril (VASOTEC) 10 MG tablet Take 1 tablet (10 mg total) by mouth daily. 12/01/18   [name], MD  ibuprofen (ADVIL,MOTRIN) 600 MG tablet Take 1 tablet (600 mg total) by mouth every 6 (six) hours as needed. 11/30/18   Adam Phenix, MD  NIFEdipine (ADALAT CC) 30 MG 24 hr tablet Take 1 tablet (30 mg total) by mouth 2 (two) times daily. 11/30/18   Adam Phenix, MD  ondansetron (ZOFRAN-ODT) 4 MG disintegrating tablet 4mg  ODT q4 hours prn nausea/vomit 11/05/21   11/07/21, DO      Allergies    Patient has no known allergies.    Review of Systems   Review of Systems  Constitutional:  Positive for unexpected weight change. Negative for chills and fever.  Respiratory:  Negative for shortness of breath.   Gastrointestinal:  Positive for diarrhea, nausea and vomiting. Negative for abdominal pain.  Genitourinary:  Negative for dysuria and frequency.  Neurological:  Positive for headaches. Negative for syncope and light-headedness.  All other systems reviewed and are negative.  Physical Exam Updated  Vital Signs BP 137/87 (BP Location: Left Arm)   Pulse (!) 53   Temp 97.8 F (36.6 C)   Resp 16   LMP 03/23/2022 (Approximate)   SpO2 100%  Physical Exam Vitals and nursing note reviewed.  Constitutional:      General: She is not in acute distress.    Appearance: Normal appearance. She is well-developed. She is not ill-appearing, toxic-appearing or diaphoretic.  HENT:     Head: Normocephalic and atraumatic.     Nose: Nose normal.     Mouth/Throat:     Mouth: Mucous membranes are moist.     Pharynx: No posterior oropharyngeal erythema.  Eyes:     Conjunctiva/sclera: Conjunctivae normal.  Cardiovascular:     Rate and Rhythm: Regular rhythm. Bradycardia present.  Pulmonary:     Effort: Pulmonary effort is normal. No respiratory distress.     Breath sounds: Normal breath sounds. No wheezing.  Abdominal:     General: There is no distension.     Palpations: Abdomen is soft.     Tenderness: There is no abdominal tenderness. There is no guarding.  Musculoskeletal:        General: No deformity. Normal range of motion.  Skin:    Findings: No rash.  Neurological:     Mental Status: She is alert.     Comments: Cranial nerves III through XII intact.  Full range of motion bilateral upper and lower extremities.  5/5 strength in upper and lower extremity  and extensor and flexor muscle groups.  Sensation intact in upper and lower extremities.  PERRL.    ED Results / Procedures / Treatments   Labs (all labs ordered are listed, but only abnormal results are displayed) Labs Reviewed  PREGNANCY, URINE    EKG None  Radiology No results found.  Procedures Procedures    Medications Ordered in ED Medications  ketorolac (TORADOL) 15 MG/ML injection 15 mg (15 mg Intramuscular Given 04/20/22 1448)  prochlorperazine (COMPAZINE) injection 10 mg (10 mg Intramuscular Given 04/20/22 1449)    ED Course/ Medical Decision Making/ A&P                           Medical Decision Making Amount  and/or Complexity of Data Reviewed Labs: ordered.  Risk Prescription drug management.   31 year old female presents today for evaluation of headache, 1 week duration of gastroenteritis symptoms.  She also reports significant weight loss of about 20 pounds in the past month but not associated with night sweats, or fever.  Abdomen is benign.  Headache started last night.  Will provide migraine cocktail, obtain basic blood work.  She is requesting a pregnancy test today.  As nurse attempted to draw blood patient refused all work-up and stated she would follow-up with her PCP tomorrow.  She states she is afraid to have IV inserted, or blood work drawn due to concern of air embolus.  She states her dad passed away from this.  I had a discussion with her and she was agreeable to at least have IM medicines, and have a urine pregnancy test done.  She states she follow-up with her PCP tomorrow.  Return precautions discussed.  Patient voices understanding and is in agreement with plan. Pregnancy test negative.  Reports improvement in symptoms following migraine cocktail.  Patient is appropriate for discharge.  Zofran prescribed.  She will follow-up with her PCP.   Final Clinical Impression(s) / ED Diagnoses Final diagnoses:  Gastroenteritis  Nonintractable headache, unspecified chronicity pattern, unspecified headache type    Rx / DC Orders ED Discharge Orders          Ordered    ondansetron (ZOFRAN-ODT) 4 MG disintegrating tablet  Every 8 hours PRN        04/20/22 1540              Marita Kansas, PA-C 04/20/22 1540    Melene Plan, DO 04/22/22 [phone]

## 2022-07-07 ENCOUNTER — Encounter: Payer: Medicaid Other | Admitting: Advanced Practice Midwife

## 2022-11-23 NOTE — L&D Delivery Note (Signed)
 [facility] Delivery Note: C-Section   Review the Delivery Report for details. <redacted file path>   Gestational Age: [redacted]w[redacted]d Gravida/Para: [id number] Labor Complications:   Quantitative Blood Loss: 290 mL Delivery Type: C-Section, Low Transverse  ROM to Delivery Time: rupture date, rupture time, delivery date, or delivery time have not been documented Newborn Weight: 3.69 kg (8 lb 2.2 oz)   Delivery Details: Christina Zavala, a 32 y.o. [phone] female delivered a viable female  infant with Apgars of   and    Delivery was via C-Section, Low Transverse  to a sterile field under Spinal  anesthesia. Infant delivered in Vertex    The cord was clamped twice, cut and   were noted. Cord blood was obtained in routine fashion with the following disposition:  .  Cord complications were   and were   around the   with   loops, which were     placenta delivered at  . Placental disposition was  .   Infant and patient in good and stable condition.    Electronically Signed: Darian M LaNeave, MD 12/28/2022 / 8:24 AM

## 2024-09-14 ENCOUNTER — Encounter: Payer: Self-pay | Admitting: Family Medicine

## 2024-09-14 ENCOUNTER — Other Ambulatory Visit (HOSPITAL_COMMUNITY)
Admission: RE | Admit: 2024-09-14 | Discharge: 2024-09-14 | Disposition: A | Source: Ambulatory Visit | Attending: Family Medicine | Admitting: Family Medicine

## 2024-09-14 ENCOUNTER — Ambulatory Visit: Admitting: Family Medicine

## 2024-09-14 VITALS — BP 135/88 | HR 62 | Ht 63.0 in | Wt 147.1 lb

## 2024-09-14 DIAGNOSIS — Z3202 Encounter for pregnancy test, result negative: Secondary | ICD-10-CM | POA: Diagnosis not present

## 2024-09-14 DIAGNOSIS — Z01419 Encounter for gynecological examination (general) (routine) without abnormal findings: Secondary | ICD-10-CM | POA: Insufficient documentation

## 2024-09-14 DIAGNOSIS — N898 Other specified noninflammatory disorders of vagina: Secondary | ICD-10-CM | POA: Diagnosis not present

## 2024-09-14 DIAGNOSIS — Z3009 Encounter for other general counseling and advice on contraception: Secondary | ICD-10-CM

## 2024-09-14 DIAGNOSIS — Z113 Encounter for screening for infections with a predominantly sexual mode of transmission: Secondary | ICD-10-CM | POA: Insufficient documentation

## 2024-09-14 LAB — CERVICOVAGINAL ANCILLARY ONLY
Chlamydia: NEGATIVE
Comment: NEGATIVE
Comment: NEGATIVE
Comment: NORMAL
Neisseria Gonorrhea: NEGATIVE
Trichomonas: NEGATIVE

## 2024-09-14 LAB — POCT URINE PREGNANCY: Preg Test, Ur: NEGATIVE

## 2024-09-14 NOTE — Progress Notes (Unsigned)
 ANNUAL EXAM Patient name: Christina Zavala MRN [id number]  Date of birth: [birth year] Chief Complaint:   Annual Exam  History of Present Illness:   Christina Zavala is a 33 y.o.  [phone]  female  being seen today for a routine annual exam.  Current complaints: Complains of vaginal odor.  She attributes this to having retained products after a IUFD in 2020 with retained products.  She does retain products for elimination, but she reports foul vaginal odor since that time.  Patient's last menstrual period was 09/10/2024 (exact date).    Last pap uncertain. Results were: negative per patient. H/O abnormal pap: no Last mammogram: n/a     09/14/2024    8:55 AM  Depression screen PHQ 2/9  Decreased Interest 1  Down, Depressed, Hopeless 1  PHQ - 2 Score 2  Altered sleeping 1  Tired, decreased energy 1  Change in appetite 1  Trouble concentrating 1  Moving slowly or fidgety/restless 0  Suicidal thoughts 0  PHQ-9 Score 6        09/14/2024    8:56 AM  GAD 7 : Generalized Anxiety Score  Nervous, Anxious, on Edge 1  Control/stop worrying 1  Worry too much - different things 1  Trouble relaxing 1  Restless 0  Easily annoyed or irritable 0  Afraid - awful might happen 0  Total GAD 7 Score 4     Review of Systems:   Pertinent items are noted in HPI Denies any headaches, blurred vision, fatigue, shortness of breath, chest pain, abdominal pain, abnormal vaginal discharge/itching/odor/irritation, problems with periods, bowel movements, urination, or intercourse unless otherwise stated above. Pertinent History Reviewed:  Reviewed past medical,surgical, social and family history.  Reviewed problem list, medications and allergies. Physical Assessment:   Vitals:   09/14/24 0904  BP: 135/88  Pulse: 62  Weight: 147 lb 1.3 oz (66.7 kg)  Height: 5' 3 (1.6 m)  Body mass index is 26.05 kg/m.        Physical Examination:   General appearance - well appearing, and in no  distress  Mental status - alert, oriented to person, place, and time  Psych:  She has a normal mood and affect  Skin - warm and dry, normal color, no suspicious lesions noted  Chest - effort normal, all lung fields clear to auscultation bilaterally  Heart - normal rate and regular rhythm  Neck:  midline trachea, no thyromegaly or nodules  Breasts - breasts appear normal, no suspicious masses, no skin or nipple changes or axillary nodes  Abdomen - soft, nontender, nondistended, no masses or organomegaly  Pelvic - VULVA: normal appearing vulva with no masses, tenderness or lesions  VAGINA: normal appearing vagina with normal color and discharge, no lesions  CERVIX: normal appearing cervix without discharge or lesions, no CMT  Thin prep pap is done with HR HPV cotesting  UTERUS: uterus is felt to be normal size, shape, consistency and nontender   ADNEXA: No adnexal masses or tenderness noted.  Extremities:  No swelling or varicosities noted  Chaperone present for exam  Assessment & Plan:  1. Well woman exam with routine gynecological exam (Primary) - Cytology - PAP( [place])  2. Encounter for counseling regarding contraception Desires Depo. Had unprotected sex about 1 week ago. F/u in 1 week - no unprotected sex during that time. - POCT urine pregnancy  3. Vaginal discharge - Cervicovaginal ancillary only( [place])   Labs/procedures today:   Orders Placed This Encounter  Procedures  POCT urine pregnancy    Meds: No orders of the defined types were placed in this encounter.   Follow-up: No follow-ups on file.  Keyron Pokorski J Yachet Mattson, DO 09/14/2024 1:05 PM

## 2024-09-15 ENCOUNTER — Ambulatory Visit: Payer: Self-pay | Admitting: Family Medicine

## 2024-09-15 LAB — CYTOLOGY - PAP
Adequacy: ABSENT
Comment: NEGATIVE
Diagnosis: NEGATIVE
High risk HPV: NEGATIVE

## 2024-09-21 ENCOUNTER — Ambulatory Visit

## 2024-09-28 ENCOUNTER — Emergency Department (HOSPITAL_BASED_OUTPATIENT_CLINIC_OR_DEPARTMENT_OTHER)

## 2024-09-28 ENCOUNTER — Other Ambulatory Visit: Payer: Self-pay

## 2024-09-28 ENCOUNTER — Emergency Department (HOSPITAL_BASED_OUTPATIENT_CLINIC_OR_DEPARTMENT_OTHER)
Admission: EM | Admit: 2024-09-28 | Discharge: 2024-09-28 | Disposition: A | Discharge status: Home / Self Care | Attending: Emergency Medicine | Admitting: Emergency Medicine

## 2024-09-28 ENCOUNTER — Encounter (HOSPITAL_BASED_OUTPATIENT_CLINIC_OR_DEPARTMENT_OTHER): Payer: Self-pay | Admitting: Emergency Medicine

## 2024-09-28 DIAGNOSIS — S3992XA Unspecified injury of lower back, initial encounter: Secondary | ICD-10-CM | POA: Diagnosis present

## 2024-09-28 DIAGNOSIS — I10 Essential (primary) hypertension: Secondary | ICD-10-CM | POA: Diagnosis not present

## 2024-09-28 DIAGNOSIS — M542 Cervicalgia: Secondary | ICD-10-CM | POA: Diagnosis not present

## 2024-09-28 DIAGNOSIS — S76012A Strain of muscle, fascia and tendon of left hip, initial encounter: Secondary | ICD-10-CM | POA: Diagnosis not present

## 2024-09-28 DIAGNOSIS — Y92410 Unspecified street and highway as the place of occurrence of the external cause: Secondary | ICD-10-CM | POA: Insufficient documentation

## 2024-09-28 DIAGNOSIS — R519 Headache, unspecified: Secondary | ICD-10-CM | POA: Diagnosis not present

## 2024-09-28 DIAGNOSIS — S39012A Strain of muscle, fascia and tendon of lower back, initial encounter: Secondary | ICD-10-CM | POA: Diagnosis not present

## 2024-09-28 LAB — URINALYSIS, ROUTINE W REFLEX MICROSCOPIC
Bilirubin Urine: NEGATIVE
Glucose, UA: NEGATIVE mg/dL
Ketones, ur: NEGATIVE mg/dL
Leukocytes,Ua: NEGATIVE
Nitrite: NEGATIVE
Protein, ur: NEGATIVE mg/dL
Specific Gravity, Urine: 1.01 (ref 1.005–1.030)
pH: 6 (ref 5.0–8.0)

## 2024-09-28 LAB — URINALYSIS, MICROSCOPIC (REFLEX): WBC, UA: NONE SEEN WBC/hpf (ref 0–5)

## 2024-09-28 LAB — PREGNANCY, URINE: Preg Test, Ur: NEGATIVE

## 2024-09-28 MED ORDER — CYCLOBENZAPRINE HCL 5 MG PO TABS: 5.0000 mg | ORAL_TABLET | Freq: Three times a day (TID) | ORAL | 0 refills | Status: AC | PRN

## 2024-09-28 MED ORDER — ACETAMINOPHEN 325 MG PO TABS
650.0000 mg | ORAL_TABLET | Freq: Once | ORAL | Status: AC
  Administered 2024-09-28: 650 mg via ORAL
  Filled 2024-09-28: qty 2
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
